# Patient Record
Sex: Male | Born: 1940 | Race: White | Hispanic: No | Marital: Married | Smoking: Never smoker
Health system: Southern US, Community
[De-identification: ages and names within clinical notes are randomized; demographics above are authoritative.]

## PROBLEM LIST (undated history)

## (undated) DIAGNOSIS — K429 Umbilical hernia without obstruction or gangrene: Secondary | ICD-10-CM

## (undated) DIAGNOSIS — Z87442 Personal history of urinary calculi: Secondary | ICD-10-CM

## (undated) DIAGNOSIS — E785 Hyperlipidemia, unspecified: Secondary | ICD-10-CM

## (undated) DIAGNOSIS — R519 Headache, unspecified: Secondary | ICD-10-CM

## (undated) DIAGNOSIS — G5601 Carpal tunnel syndrome, right upper limb: Secondary | ICD-10-CM

## (undated) DIAGNOSIS — G4730 Sleep apnea, unspecified: Secondary | ICD-10-CM

## (undated) DIAGNOSIS — M1990 Unspecified osteoarthritis, unspecified site: Secondary | ICD-10-CM

## (undated) DIAGNOSIS — K219 Gastro-esophageal reflux disease without esophagitis: Secondary | ICD-10-CM

## (undated) DIAGNOSIS — K5790 Diverticulosis of intestine, part unspecified, without perforation or abscess without bleeding: Secondary | ICD-10-CM

## (undated) DIAGNOSIS — G4700 Insomnia, unspecified: Secondary | ICD-10-CM

## (undated) DIAGNOSIS — R51 Headache: Secondary | ICD-10-CM

## (undated) DIAGNOSIS — N400 Enlarged prostate without lower urinary tract symptoms: Secondary | ICD-10-CM

## (undated) DIAGNOSIS — E7800 Pure hypercholesterolemia, unspecified: Secondary | ICD-10-CM

## (undated) DIAGNOSIS — I10 Essential (primary) hypertension: Secondary | ICD-10-CM

## (undated) DIAGNOSIS — G43909 Migraine, unspecified, not intractable, without status migrainosus: Secondary | ICD-10-CM

## (undated) HISTORY — PX: HERNIA REPAIR: SHX51

## (undated) HISTORY — PX: UMBILICAL HERNIA REPAIR: SHX196

## (undated) HISTORY — PX: LITHOTRIPSY: SUR834

## (undated) HISTORY — PX: TONSILLECTOMY: SUR1361

## (undated) HISTORY — PX: CARDIAC CATHETERIZATION: SHX172

## (undated) HISTORY — PX: BUNIONECTOMY: SHX129

## (undated) HISTORY — PX: COLONOSCOPY: SHX174

---

## 1996-05-01 HISTORY — PX: SHOULDER ARTHROSCOPY WITH ROTATOR CUFF REPAIR: SHX5685

## 2015-07-14 DIAGNOSIS — I10 Essential (primary) hypertension: Secondary | ICD-10-CM | POA: Insufficient documentation

## 2015-07-14 DIAGNOSIS — N400 Enlarged prostate without lower urinary tract symptoms: Secondary | ICD-10-CM | POA: Insufficient documentation

## 2015-07-14 DIAGNOSIS — E785 Hyperlipidemia, unspecified: Secondary | ICD-10-CM | POA: Insufficient documentation

## 2016-07-14 ENCOUNTER — Ambulatory Visit
Admission: RE | Admit: 2016-07-14 | Discharge: 2016-07-14 | Disposition: A | Discharge status: Home / Self Care | Payer: Medicare Other | Source: Ambulatory Visit | Attending: Unknown Physician Specialty | Admitting: Unknown Physician Specialty

## 2016-07-14 ENCOUNTER — Other Ambulatory Visit: Payer: Self-pay | Admitting: Unknown Physician Specialty

## 2016-07-14 DIAGNOSIS — X58XXXA Exposure to other specified factors, initial encounter: Secondary | ICD-10-CM | POA: Insufficient documentation

## 2016-07-14 DIAGNOSIS — M25561 Pain in right knee: Secondary | ICD-10-CM | POA: Diagnosis present

## 2016-07-14 DIAGNOSIS — S83241A Other tear of medial meniscus, current injury, right knee, initial encounter: Secondary | ICD-10-CM | POA: Insufficient documentation

## 2016-07-14 DIAGNOSIS — M1711 Unilateral primary osteoarthritis, right knee: Secondary | ICD-10-CM | POA: Insufficient documentation

## 2016-09-14 ENCOUNTER — Encounter
Admission: RE | Admit: 2016-09-14 | Discharge: 2016-09-14 | Disposition: A | Discharge status: Home / Self Care | Payer: Medicare Other | Source: Ambulatory Visit | Attending: Orthopedic Surgery | Admitting: Orthopedic Surgery

## 2016-09-14 DIAGNOSIS — S83231A Complex tear of medial meniscus, current injury, right knee, initial encounter: Secondary | ICD-10-CM | POA: Diagnosis not present

## 2016-09-14 DIAGNOSIS — I10 Essential (primary) hypertension: Secondary | ICD-10-CM | POA: Insufficient documentation

## 2016-09-14 DIAGNOSIS — X58XXXA Exposure to other specified factors, initial encounter: Secondary | ICD-10-CM | POA: Insufficient documentation

## 2016-09-14 HISTORY — DX: Gastro-esophageal reflux disease without esophagitis: K21.9

## 2016-09-14 HISTORY — DX: Headache: R51

## 2016-09-14 HISTORY — DX: Headache, unspecified: R51.9

## 2016-09-14 HISTORY — DX: Pure hypercholesterolemia, unspecified: E78.00

## 2016-09-14 HISTORY — DX: Sleep apnea, unspecified: G47.30

## 2016-09-14 HISTORY — DX: Unspecified osteoarthritis, unspecified site: M19.90

## 2016-09-14 HISTORY — DX: Personal history of urinary calculi: Z87.442

## 2016-09-14 HISTORY — DX: Essential (primary) hypertension: I10

## 2016-09-14 LAB — CBC
HCT: 45.7 % (ref 40.0–52.0)
HEMOGLOBIN: 15.7 g/dL (ref 13.0–18.0)
MCH: 29.5 pg (ref 26.0–34.0)
MCHC: 34.4 g/dL (ref 32.0–36.0)
MCV: 85.7 fL (ref 80.0–100.0)
Platelets: 148 10*3/uL — ABNORMAL LOW (ref 150–440)
RBC: 5.33 MIL/uL (ref 4.40–5.90)
RDW: 13.3 % (ref 11.5–14.5)
WBC: 7.4 10*3/uL (ref 3.8–10.6)

## 2016-09-14 LAB — DIFFERENTIAL
BASOS PCT: 1 %
Basophils Absolute: 0.1 10*3/uL (ref 0–0.1)
EOS ABS: 0.1 10*3/uL (ref 0–0.7)
EOS PCT: 2 %
LYMPHS ABS: 1.9 10*3/uL (ref 1.0–3.6)
Lymphocytes Relative: 25 %
Monocytes Absolute: 0.8 10*3/uL (ref 0.2–1.0)
Monocytes Relative: 10 %
NEUTROS PCT: 62 %
Neutro Abs: 4.6 10*3/uL (ref 1.4–6.5)

## 2016-09-14 NOTE — Patient Instructions (Signed)
  Your procedure is scheduled on: Sep 21, 2016 (Thursday) Report to Same Day Surgery 2nd floor medical mall [facility](Medical Mall Entrance-take elevator on left to 2nd floor.  Check in with surgery information desk.) To find out your arrival time please call [phone] between 1PM - 3PM on Sep 20, 2016 (Wednesday)   Remember: Instructions that are not followed completely may result in serious medical risk, up to and including death, or upon the discretion of your surgeon and anesthesiologist your surgery may need to be rescheduled.    _x___ 1. Do not eat food or drink liquids after midnight. No gum chewing or hard candies                               __x__ 2. No Alcohol for 24 hours before or after surgery.   __x__3. No Smoking for 24 prior to surgery.   ____  4. Bring all medications with you on the day of surgery if instructed.    __x__ 5. Notify your doctor if there is any change in your medical condition     (cold, fever, infections).     Do not wear jewelry, make-up, hairpins, clips or nail polish.  Do not wear lotions, powders, or perfumes.   Do not shave 48 hours prior to surgery. Men may shave face and neck.  Do not bring valuables to the hospital.    [facility] is not responsible for any belongings or valuables.               Contacts, dentures or bridgework may not be worn into surgery.  Leave your suitcase in the car. After surgery it may be brought to your room.  For patients admitted to the hospital, discharge time is determined by your treatment team                        Patients discharged the day of surgery will not be allowed to drive home.  You will need someone to drive you home and stay with you the night of your procedure.    Please read over the following fact sheets that you were given:   [facility] Preparing for Surgery and or MRSA Information   _x___ Take anti-hypertensive (unless it includes a diuretic), cardiac, seizure, asthma,     anti-reflux and  psychiatric medicines. These include:  1.   2.  3.  4.  5.  6.  ____Fleets enema or Magnesium Citrate as directed.   _x___ Use CHG Soap or sage wipes as directed on instruction sheet   ____ Use inhalers on the day of surgery and bring to hospital day of surgery  ____ Stop Metformin and Janumet 2 days prior to surgery.    ____ Take 1/2 of usual insulin dose the night before surgery and none on the morning     surgery.   _x___ Follow recommendations from Cardiologist, Pulmonologist or PCP regarding          stopping Aspirin, Coumadin, Pllavix ,Eliquis, Effient, or Pradaxa, and Pletal. (STOP ASPIRIN NOW)  X____Stop Anti-inflammatories such as Advil, Aleve, Ibuprofen, Motrin, Naproxen, Naprosyn, Goodies powders or aspirin products. OK to take Tylenol  (STOP VOLTAREN GEL NOW)  _x___ Stop supplements until after surgery.  But may continue Vitamin D, Vitamin B,  and multivitamin    _x___ Bring C-Pap to the hospital.

## 2016-09-20 MED ORDER — CEFAZOLIN SODIUM-DEXTROSE 2-4 GM/100ML-% IV SOLN
2.0000 g | Freq: Once | INTRAVENOUS | Status: AC
  Administered 2016-09-21: 2 g via INTRAVENOUS

## 2016-09-21 ENCOUNTER — Ambulatory Visit: Payer: Medicare Other | Admitting: Anesthesiology

## 2016-09-21 ENCOUNTER — Encounter: Payer: Self-pay | Admitting: *Deleted

## 2016-09-21 ENCOUNTER — Encounter
Admission: RE | Disposition: A | Discharge status: Home / Self Care | Payer: Self-pay | Source: Ambulatory Visit | Attending: Orthopedic Surgery

## 2016-09-21 ENCOUNTER — Ambulatory Visit
Admission: RE | Admit: 2016-09-21 | Discharge: 2016-09-21 | Disposition: A | Discharge status: Home / Self Care | Payer: Medicare Other | Source: Ambulatory Visit | Attending: Orthopedic Surgery | Admitting: Orthopedic Surgery

## 2016-09-21 DIAGNOSIS — Z803 Family history of malignant neoplasm of breast: Secondary | ICD-10-CM | POA: Insufficient documentation

## 2016-09-21 DIAGNOSIS — X58XXXA Exposure to other specified factors, initial encounter: Secondary | ICD-10-CM | POA: Insufficient documentation

## 2016-09-21 DIAGNOSIS — Z823 Family history of stroke: Secondary | ICD-10-CM | POA: Insufficient documentation

## 2016-09-21 DIAGNOSIS — K219 Gastro-esophageal reflux disease without esophagitis: Secondary | ICD-10-CM | POA: Insufficient documentation

## 2016-09-21 DIAGNOSIS — G4730 Sleep apnea, unspecified: Secondary | ICD-10-CM | POA: Diagnosis not present

## 2016-09-21 DIAGNOSIS — Z7982 Long term (current) use of aspirin: Secondary | ICD-10-CM | POA: Insufficient documentation

## 2016-09-21 DIAGNOSIS — Z8042 Family history of malignant neoplasm of prostate: Secondary | ICD-10-CM | POA: Diagnosis not present

## 2016-09-21 DIAGNOSIS — E785 Hyperlipidemia, unspecified: Secondary | ICD-10-CM | POA: Insufficient documentation

## 2016-09-21 DIAGNOSIS — Z79899 Other long term (current) drug therapy: Secondary | ICD-10-CM | POA: Insufficient documentation

## 2016-09-21 DIAGNOSIS — G43909 Migraine, unspecified, not intractable, without status migrainosus: Secondary | ICD-10-CM | POA: Insufficient documentation

## 2016-09-21 DIAGNOSIS — S83231A Complex tear of medial meniscus, current injury, right knee, initial encounter: Secondary | ICD-10-CM | POA: Insufficient documentation

## 2016-09-21 DIAGNOSIS — I10 Essential (primary) hypertension: Secondary | ICD-10-CM | POA: Insufficient documentation

## 2016-09-21 DIAGNOSIS — Z833 Family history of diabetes mellitus: Secondary | ICD-10-CM | POA: Diagnosis not present

## 2016-09-21 HISTORY — PX: KNEE ARTHROSCOPY: SHX127

## 2016-09-21 SURGERY — ARTHROSCOPY, KNEE
Anesthesia: General | Site: Knee | Laterality: Right | Wound class: Clean

## 2016-09-21 MED ORDER — ONDANSETRON HCL 4 MG/2ML IJ SOLN
INTRAMUSCULAR | Status: DC | PRN
  Administered 2016-09-21: 4 mg via INTRAVENOUS

## 2016-09-21 MED ORDER — HYDROCODONE-ACETAMINOPHEN 5-325 MG PO TABS
ORAL_TABLET | ORAL | Status: AC
  Administered 2016-09-21: 1 via ORAL
  Filled 2016-09-21: qty 1

## 2016-09-21 MED ORDER — LIDOCAINE HCL (CARDIAC) 20 MG/ML IV SOLN: INTRAVENOUS | Status: DC | PRN

## 2016-09-21 MED ORDER — FAMOTIDINE 20 MG PO TABS
20.0000 mg | ORAL_TABLET | Freq: Once | ORAL | Status: AC
  Administered 2016-09-21: 20 mg via ORAL

## 2016-09-21 MED ORDER — BUPIVACAINE-EPINEPHRINE (PF) 0.5% -1:200000 IJ SOLN
INTRAMUSCULAR | Status: AC
Start: 2016-09-21 — End: ?
  Filled 2016-09-21: qty 30

## 2016-09-21 MED ORDER — CEFAZOLIN SODIUM-DEXTROSE 2-4 GM/100ML-% IV SOLN
INTRAVENOUS | Status: AC
  Filled 2016-09-21: qty 100

## 2016-09-21 MED ORDER — PROPOFOL 10 MG/ML IV BOLUS
INTRAVENOUS | Status: AC
  Filled 2016-09-21: qty 20

## 2016-09-21 MED ORDER — FENTANYL CITRATE (PF) 100 MCG/2ML IJ SOLN
INTRAMUSCULAR | Status: AC
  Administered 2016-09-21: 25 ug via INTRAVENOUS
  Filled 2016-09-21: qty 2

## 2016-09-21 MED ORDER — ACETAMINOPHEN 10 MG/ML IV SOLN
INTRAVENOUS | Status: DC | PRN
  Administered 2016-09-21: 1000 mg via INTRAVENOUS

## 2016-09-21 MED ORDER — FENTANYL CITRATE (PF) 100 MCG/2ML IJ SOLN
25.0000 ug | INTRAMUSCULAR | Status: AC | PRN
  Administered 2016-09-21 (×6): 25 ug via INTRAVENOUS

## 2016-09-21 MED ORDER — FENTANYL CITRATE (PF) 100 MCG/2ML IJ SOLN
INTRAMUSCULAR | Status: DC | PRN
  Administered 2016-09-21: 100 ug via INTRAVENOUS

## 2016-09-21 MED ORDER — PROPOFOL 10 MG/ML IV BOLUS
INTRAVENOUS | Status: DC | PRN
  Administered 2016-09-21: 150 mg via INTRAVENOUS

## 2016-09-21 MED ORDER — ONDANSETRON HCL 4 MG/2ML IJ SOLN: 4.0000 mg | Freq: Once | INTRAMUSCULAR | Status: DC | PRN

## 2016-09-21 MED ORDER — HYDROCODONE-ACETAMINOPHEN 5-325 MG PO TABS: 1.0000 | ORAL_TABLET | ORAL | 0 refills | Status: DC | PRN

## 2016-09-21 MED ORDER — ONDANSETRON HCL 4 MG/2ML IJ SOLN
INTRAMUSCULAR | Status: AC
  Filled 2016-09-21: qty 2

## 2016-09-21 MED ORDER — KETOROLAC TROMETHAMINE 30 MG/ML IJ SOLN
INTRAMUSCULAR | Status: AC
  Filled 2016-09-21: qty 1

## 2016-09-21 MED ORDER — MIDAZOLAM HCL 2 MG/2ML IJ SOLN
INTRAMUSCULAR | Status: DC | PRN
  Administered 2016-09-21: 2 mg via INTRAVENOUS

## 2016-09-21 MED ORDER — FENTANYL CITRATE (PF) 100 MCG/2ML IJ SOLN
INTRAMUSCULAR | Status: AC
  Filled 2016-09-21: qty 2

## 2016-09-21 MED ORDER — MIDAZOLAM HCL 2 MG/2ML IJ SOLN
INTRAMUSCULAR | Status: AC
  Filled 2016-09-21: qty 2

## 2016-09-21 MED ORDER — DEXAMETHASONE SODIUM PHOSPHATE 10 MG/ML IJ SOLN
INTRAMUSCULAR | Status: DC | PRN
  Administered 2016-09-21: 10 mg via INTRAVENOUS

## 2016-09-21 MED ORDER — LIDOCAINE HCL (PF) 2 % IJ SOLN
INTRAMUSCULAR | Status: AC
  Filled 2016-09-21: qty 2

## 2016-09-21 MED ORDER — DEXAMETHASONE SODIUM PHOSPHATE 10 MG/ML IJ SOLN
INTRAMUSCULAR | Status: AC
  Filled 2016-09-21: qty 1

## 2016-09-21 MED ORDER — BUPIVACAINE-EPINEPHRINE (PF) 0.5% -1:200000 IJ SOLN
INTRAMUSCULAR | Status: DC | PRN
  Administered 2016-09-21: 20 mL via PERINEURAL

## 2016-09-21 MED ORDER — SUCCINYLCHOLINE CHLORIDE 20 MG/ML IJ SOLN
INTRAMUSCULAR | Status: DC | PRN
  Administered 2016-09-21: 100 mg via INTRAVENOUS

## 2016-09-21 MED ORDER — HYDROCODONE-ACETAMINOPHEN 5-325 MG PO TABS
1.0000 | ORAL_TABLET | Freq: Once | ORAL | Status: AC
  Administered 2016-09-21: 1 via ORAL

## 2016-09-21 MED ORDER — LACTATED RINGERS IV SOLN
INTRAVENOUS | Status: DC
  Administered 2016-09-21 (×2): via INTRAVENOUS

## 2016-09-21 MED ORDER — FAMOTIDINE 20 MG PO TABS
ORAL_TABLET | ORAL | Status: AC
  Administered 2016-09-21: 20 mg via ORAL
  Filled 2016-09-21: qty 1

## 2016-09-21 MED ORDER — SUGAMMADEX SODIUM 200 MG/2ML IV SOLN
INTRAVENOUS | Status: AC
  Filled 2016-09-21: qty 2

## 2016-09-21 MED ORDER — LIDOCAINE HCL (CARDIAC) 20 MG/ML IV SOLN
INTRAVENOUS | Status: DC | PRN
  Administered 2016-09-21: 100 mg via INTRAVENOUS

## 2016-09-21 MED ORDER — ACETAMINOPHEN 10 MG/ML IV SOLN
INTRAVENOUS | Status: AC
  Filled 2016-09-21: qty 100

## 2016-09-21 MED ORDER — SUGAMMADEX SODIUM 500 MG/5ML IV SOLN
INTRAVENOUS | Status: DC | PRN
  Administered 2016-09-21: 181.4 mg via INTRAVENOUS

## 2016-09-21 MED ORDER — KETOROLAC TROMETHAMINE 30 MG/ML IJ SOLN
INTRAMUSCULAR | Status: DC | PRN
  Administered 2016-09-21: 30 mg via INTRAVENOUS

## 2016-09-21 MED ORDER — ROCURONIUM BROMIDE 100 MG/10ML IV SOLN
INTRAVENOUS | Status: DC | PRN
  Administered 2016-09-21: 50 mg via INTRAVENOUS

## 2016-09-21 SURGICAL SUPPLY — 26 items
BANDAGE ACE 4X5 VEL STRL LF (GAUZE/BANDAGES/DRESSINGS) IMPLANT
BLADE INCISOR PLUS 4.5 (BLADE) IMPLANT
CHLORAPREP W/TINT 26ML (MISCELLANEOUS) ×2 IMPLANT
CUFF TOURN 24 STER (MISCELLANEOUS) ×2 IMPLANT
CUFF TOURN 30 STER DUAL PORT (MISCELLANEOUS) IMPLANT
GAUZE SPONGE 4X4 12PLY STRL (GAUZE/BANDAGES/DRESSINGS) ×2 IMPLANT
GLOVE BIO SURGEON STRL SZ7 (GLOVE) ×6 IMPLANT
GLOVE INDICATOR 7.5 STRL GRN (GLOVE) ×6 IMPLANT
GLOVE SURG SYN 9.0  PF PI (GLOVE) ×1
GLOVE SURG SYN 9.0 PF PI (GLOVE) ×1 IMPLANT
GOWN SRG 2XL LVL 4 RGLN SLV (GOWNS) ×1 IMPLANT
GOWN STRL NON-REIN 2XL LVL4 (GOWNS) ×1
GOWN STRL REUS W/ TWL LRG LVL3 (GOWN DISPOSABLE) ×3 IMPLANT
GOWN STRL REUS W/TWL LRG LVL3 (GOWN DISPOSABLE) ×3
IV LACTATED RINGER IRRG 3000ML (IV SOLUTION) ×3
IV LR IRRIG 3000ML ARTHROMATIC (IV SOLUTION) ×3 IMPLANT
KIT RM TURNOVER STRD PROC AR (KITS) ×2 IMPLANT
MANIFOLD NEPTUNE II (INSTRUMENTS) ×2 IMPLANT
PACK ARTHROSCOPY KNEE (MISCELLANEOUS) ×2 IMPLANT
SET TUBE SUCT SHAVER OUTFL 24K (TUBING) ×2 IMPLANT
SET TUBE TIP INTRA-ARTICULAR (MISCELLANEOUS) ×2 IMPLANT
SUT ETHILON 4-0 (SUTURE) ×1
SUT ETHILON 4-0 FS2 18XMFL BLK (SUTURE) ×1
SUTURE ETHLN 4-0 FS2 18XMF BLK (SUTURE) ×1 IMPLANT
TUBING ARTHRO INFLOW-ONLY STRL (TUBING) ×2 IMPLANT
WAND COBLATION FLOW 50 (SURGICAL WAND) ×2 IMPLANT

## 2016-09-21 NOTE — Anesthesia Procedure Notes (Signed)
Procedure Name: Intubation Date/Time: 09/21/2016 9:46 AM Performed by: Nelda Marseille Pre-anesthesia Checklist: Patient identified, Patient being monitored, Timeout performed, Emergency Drugs available and Suction available Patient Re-evaluated:Patient Re-evaluated prior to inductionOxygen Delivery Method: Circle system utilized Preoxygenation: Pre-oxygenation with 100% oxygen Intubation Type: IV induction Ventilation: Mask ventilation without difficulty Laryngoscope Size: Mac, 3 and McGraph Grade View: Grade I Tube type: Oral Tube size: 7.5 mm Number of attempts: 1 Airway Equipment and Method: Stylet and Video-laryngoscopy Placement Confirmation: ETT inserted through vocal cords under direct vision,  positive ETCO2 and breath sounds checked- equal and bilateral Secured at: 21 cm Tube secured with: Tape Dental Injury: Teeth and Oropharynx as per pre-operative assessment

## 2016-09-21 NOTE — H&P (Signed)
Reviewed paper H+P, will be scanned into chart. Patient examined No changes noted.  

## 2016-09-21 NOTE — Discharge Instructions (Addendum)
Keep dressing clean and dry. If bandage slides down the leg, remove entire bandage and used to Band-Aids then rewrap with Ace wrap only. Continue aspirin daily. Pain medicine as directed could try half a tablet at a time   AMBULATORY SURGERY  DISCHARGE INSTRUCTIONS   1) The drugs that you were given will stay in your system until tomorrow so for the next 24 hours you should not:  A) Drive an automobile B) Make any legal decisions C) Drink any alcoholic beverage   2) You may resume regular meals tomorrow.  Today it is better to start with liquids and gradually work up to solid foods.  You may eat anything you prefer, but it is better to start with liquids, then soup and crackers, and gradually work up to solid foods.   3) Please notify your doctor immediately if you have any unusual bleeding, trouble breathing, redness and pain at the surgery site, drainage, fever, or pain not relieved by medication.    Please contact your physician with any problems or Same Day Surgery at [phone], Monday through Friday 6 am to 4 pm, or [place] at [facility] Main number at [phone].

## 2016-09-21 NOTE — Op Note (Signed)
09/21/2016  10:27 AM  PATIENT:  Calvin Wallace  76 y.o. male  PRE-OPERATIVE DIAGNOSIS:  COMPLEX TEAR OF MEDIAL MENISCUS OF RIGHT KNEE AS CURRENT INJURY  POST-OPERATIVE DIAGNOSIS:  COMPLEX TEAR OF MEDIAL MENISCUS OF RIGHT KNEE AS CURRENT INJURY  PROCEDURE:  Procedure(s): ARTHROSCOPY KNEE MEDIAL MENISCECTOMY (Right)  SURGEON: Leitha SchullerMichael J Natan Hartog, MD  ASSISTANTS: None  ANESTHESIA:   general  EBL:  No intake/output data recorded.  BLOOD ADMINISTERED:none  DRAINS: none   LOCAL MEDICATIONS USED:  MARCAINE     SPECIMEN:  No Specimen  DISPOSITION OF SPECIMEN:  N/A  COUNTS:  YES  TOURNIQUET:   none  IMPLANTS: None  DICTATION: .Dragon Dictation patient brought the operating room and after adequate anesthesia was obtained the right leg was prepped and draped in sterile fashion with an arthroscopic leg holder and tourniquet applied. After patient identification and timeout procedures were completed, an inferior lateral portal was made and the arthroscope was introduced. Initial inspection revealed mild patellofemoral degenerative change with normal tracking. Coming around medially there inferior medial portal was made on probing there is a complex tear of the posterior and middle thirds of the medial meniscus consistent with MRI findings. There is also extensive chondrocalcinosis present a meniscus punch was used to debride the meniscus back with an ArthroCare wand to ablate the edges to smooth out the edges and get rid of some loose frayed pieces of the meniscus. This probably involved the inner two thirds of the posterior horn and a portion of the middle third. There was moderate degenerative change with chondrocalcinosis visible in the articular cartilage of the medial compartment. Lateral compartment was normal and the anterior cruciate ligament was intact the gutters were free of any loose bodies after thoroughly irrigating out the knee argentation was withdrawn and the wounds closed with  simple 4-0 nylon. 20 cc of half percent Sensorcaine with epinephrine was infiltrated near the portals and the wound dressed with Xeroform 4 x 4 web roll and Ace wrap  PLAN OF CARE: Discharge to home after PACU  PATIENT DISPOSITION:  PACU - hemodynamically stable.

## 2016-09-21 NOTE — Anesthesia Postprocedure Evaluation (Signed)
Anesthesia Post Note  Patient: Calvin Wallace  Procedure(s) Performed: Procedure(s) (LRB): ARTHROSCOPY KNEE MEDIAL MENISCECTOMY (Right)  Patient location during evaluation: PACU Anesthesia Type: General Level of consciousness: awake and alert Pain management: pain level controlled Vital Signs Assessment: post-procedure vital signs reviewed and stable Respiratory status: spontaneous breathing and respiratory function stable Cardiovascular status: stable Anesthetic complications: no     Last Vitals: There were no vitals filed for this visit.  Last Pain: There were no vitals filed for this visit.               KEPHART,WILLIAM K

## 2016-09-21 NOTE — Anesthesia Preprocedure Evaluation (Signed)
Anesthesia Evaluation  Patient identified by MRN, date of birth, ID band Patient awake    Reviewed: Allergy & Precautions, NPO status , Patient's Chart, lab work & pertinent test results  History of Anesthesia Complications Negative for: history of anesthetic complications  Airway Mallampati: II       Dental   Pulmonary sleep apnea and Continuous Positive Airway Pressure Ventilation ,           Cardiovascular hypertension, Pt. on medications      Neuro/Psych negative neurological ROS     GI/Hepatic Neg liver ROS, GERD  Medicated,  Endo/Other  negative endocrine ROS  Renal/GU negative Renal ROS     Musculoskeletal   Abdominal   Peds  Hematology   Anesthesia Other Findings   Reproductive/Obstetrics                             Anesthesia Physical Anesthesia Plan  ASA: II  Anesthesia Plan: General   Post-op Pain Management:    Induction: Intravenous  Airway Management Planned: Oral ETT  Additional Equipment:   Intra-op Plan:   Post-operative Plan:   Informed Consent: I have reviewed the patients History and Physical, chart, labs and discussed the procedure including the risks, benefits and alternatives for the proposed anesthesia with the patient or authorized representative who has indicated his/her understanding and acceptance.     Plan Discussed with:   Anesthesia Plan Comments:         Anesthesia Quick Evaluation

## 2016-09-21 NOTE — Transfer of Care (Signed)
Immediate Anesthesia Transfer of Care Note  Patient: Calvin Wallace  Procedure(s) Performed: Procedure(s): ARTHROSCOPY KNEE MEDIAL MENISCECTOMY (Right)  Patient Location: PACU  Anesthesia Type:General  Level of Consciousness: sedated  Airway & Oxygen Therapy: Patient Spontanous Breathing and Patient connected to face mask oxygen  Post-op Assessment: Report given to RN and Post -op Vital signs reviewed and stable  Post vital signs: Reviewed and stable  Last Vitals: There were no vitals filed for this visit.  Last Pain: There were no vitals filed for this visit.       Complications: No apparent anesthesia complications

## 2016-09-21 NOTE — Anesthesia Post-op Follow-up Note (Cosign Needed)
Anesthesia QCDR form completed.        

## 2017-07-10 DIAGNOSIS — K219 Gastro-esophageal reflux disease without esophagitis: Secondary | ICD-10-CM | POA: Insufficient documentation

## 2017-07-19 ENCOUNTER — Encounter: Payer: Self-pay | Admitting: *Deleted

## 2017-07-20 ENCOUNTER — Ambulatory Visit: Payer: Medicare Other | Admitting: Anesthesiology

## 2017-07-20 ENCOUNTER — Encounter: Payer: Self-pay | Admitting: *Deleted

## 2017-07-20 ENCOUNTER — Encounter
Admission: RE | Disposition: A | Discharge status: Home / Self Care | Payer: Self-pay | Source: Ambulatory Visit | Attending: Unknown Physician Specialty

## 2017-07-20 ENCOUNTER — Ambulatory Visit
Admission: RE | Admit: 2017-07-20 | Discharge: 2017-07-20 | Disposition: A | Discharge status: Home / Self Care | Payer: Medicare Other | Source: Ambulatory Visit | Attending: Unknown Physician Specialty | Admitting: Unknown Physician Specialty

## 2017-07-20 DIAGNOSIS — Z79899 Other long term (current) drug therapy: Secondary | ICD-10-CM | POA: Diagnosis not present

## 2017-07-20 DIAGNOSIS — K2970 Gastritis, unspecified, without bleeding: Secondary | ICD-10-CM | POA: Diagnosis not present

## 2017-07-20 DIAGNOSIS — Z8371 Family history of colonic polyps: Secondary | ICD-10-CM | POA: Insufficient documentation

## 2017-07-20 DIAGNOSIS — K219 Gastro-esophageal reflux disease without esophagitis: Secondary | ICD-10-CM | POA: Diagnosis not present

## 2017-07-20 DIAGNOSIS — K3189 Other diseases of stomach and duodenum: Secondary | ICD-10-CM | POA: Insufficient documentation

## 2017-07-20 DIAGNOSIS — G43909 Migraine, unspecified, not intractable, without status migrainosus: Secondary | ICD-10-CM | POA: Diagnosis not present

## 2017-07-20 DIAGNOSIS — Z7982 Long term (current) use of aspirin: Secondary | ICD-10-CM | POA: Diagnosis not present

## 2017-07-20 DIAGNOSIS — K640 First degree hemorrhoids: Secondary | ICD-10-CM | POA: Diagnosis not present

## 2017-07-20 DIAGNOSIS — K449 Diaphragmatic hernia without obstruction or gangrene: Secondary | ICD-10-CM | POA: Diagnosis not present

## 2017-07-20 DIAGNOSIS — G4733 Obstructive sleep apnea (adult) (pediatric): Secondary | ICD-10-CM | POA: Diagnosis not present

## 2017-07-20 DIAGNOSIS — E7800 Pure hypercholesterolemia, unspecified: Secondary | ICD-10-CM | POA: Diagnosis not present

## 2017-07-20 DIAGNOSIS — K5730 Diverticulosis of large intestine without perforation or abscess without bleeding: Secondary | ICD-10-CM | POA: Insufficient documentation

## 2017-07-20 DIAGNOSIS — I10 Essential (primary) hypertension: Secondary | ICD-10-CM | POA: Insufficient documentation

## 2017-07-20 DIAGNOSIS — Z1211 Encounter for screening for malignant neoplasm of colon: Secondary | ICD-10-CM | POA: Insufficient documentation

## 2017-07-20 DIAGNOSIS — E785 Hyperlipidemia, unspecified: Secondary | ICD-10-CM | POA: Diagnosis not present

## 2017-07-20 HISTORY — PX: ESOPHAGOGASTRODUODENOSCOPY (EGD) WITH PROPOFOL: SHX5813

## 2017-07-20 HISTORY — PX: COLONOSCOPY WITH PROPOFOL: SHX5780

## 2017-07-20 HISTORY — DX: Migraine, unspecified, not intractable, without status migrainosus: G43.909

## 2017-07-20 HISTORY — DX: Umbilical hernia without obstruction or gangrene: K42.9

## 2017-07-20 HISTORY — DX: Hyperlipidemia, unspecified: E78.5

## 2017-07-20 SURGERY — COLONOSCOPY WITH PROPOFOL
Anesthesia: General

## 2017-07-20 MED ORDER — PROPOFOL 10 MG/ML IV BOLUS
INTRAVENOUS | Status: DC | PRN
  Administered 2017-07-20: 30 mg via INTRAVENOUS
  Administered 2017-07-20: 50 mg via INTRAVENOUS

## 2017-07-20 MED ORDER — PROPOFOL 500 MG/50ML IV EMUL
INTRAVENOUS | Status: AC
  Filled 2017-07-20: qty 50

## 2017-07-20 MED ORDER — FENTANYL CITRATE (PF) 100 MCG/2ML IJ SOLN
INTRAMUSCULAR | Status: DC | PRN
  Administered 2017-07-20: 50 ug via INTRAVENOUS

## 2017-07-20 MED ORDER — SODIUM CHLORIDE 0.9 % IV SOLN
INTRAVENOUS | Status: DC
  Administered 2017-07-20 (×2): via INTRAVENOUS

## 2017-07-20 MED ORDER — LIDOCAINE HCL (CARDIAC) 20 MG/ML IV SOLN
INTRAVENOUS | Status: DC | PRN
  Administered 2017-07-20: 100 mg via INTRAVENOUS

## 2017-07-20 MED ORDER — SODIUM CHLORIDE 0.9 % IV SOLN: INTRAVENOUS | Status: DC

## 2017-07-20 MED ORDER — LIDOCAINE HCL (PF) 2 % IJ SOLN
INTRAMUSCULAR | Status: AC
  Filled 2017-07-20: qty 10

## 2017-07-20 MED ORDER — PROPOFOL 500 MG/50ML IV EMUL
INTRAVENOUS | Status: DC | PRN
  Administered 2017-07-20: 200 ug/kg/min via INTRAVENOUS

## 2017-07-20 MED ORDER — FENTANYL CITRATE (PF) 100 MCG/2ML IJ SOLN
INTRAMUSCULAR | Status: AC
  Filled 2017-07-20: qty 2

## 2017-07-20 NOTE — Anesthesia Preprocedure Evaluation (Signed)
Anesthesia Evaluation  Patient identified by MRN, date of birth, ID band Patient awake    Reviewed: Allergy & Precautions, NPO status , Patient's Chart, lab work & pertinent test results  History of Anesthesia Complications Negative for: history of anesthetic complications  Airway Mallampati: II       Dental  (+) Implants, Dental Advidsory Given, Teeth Intact, Caps   Pulmonary neg shortness of breath, sleep apnea and Continuous Positive Airway Pressure Ventilation , neg COPD, neg recent URI,           Cardiovascular Exercise Tolerance: Good hypertension, Pt. on medications (-) angina(-) CAD, (-) Past MI, (-) Cardiac Stents and (-) CABG (-) dysrhythmias + Valvular Problems/Murmurs      Neuro/Psych  Headaches, neg Seizures    GI/Hepatic Neg liver ROS, GERD  Medicated,  Endo/Other  negative endocrine ROS  Renal/GU negative Renal ROS     Musculoskeletal   Abdominal   Peds  Hematology   Anesthesia Other Findings Past Medical History: No date: Arthritis No date: GERD (gastroesophageal reflux disease) No date: Headache     Comment:  migraines No date: History of kidney stones No date: Hypercholesteremia No date: Hyperlipidemia No date: Hypertension No date: Migraines No date: Sleep apnea     Comment:  OSA---USE C-PAP No date: Umbilical hernia   Reproductive/Obstetrics                             Anesthesia Physical  Anesthesia Plan  ASA: II  Anesthesia Plan: General   Post-op Pain Management:    Induction: Intravenous  PONV Risk Score and Plan: 1 and Propofol infusion  Airway Management Planned: Natural Airway and Nasal Cannula  Additional Equipment:   Intra-op Plan:   Post-operative Plan:   Informed Consent: I have reviewed the patients History and Physical, chart, labs and discussed the procedure including the risks, benefits and alternatives for the proposed anesthesia  with the patient or authorized representative who has indicated his/her understanding and acceptance.     Plan Discussed with:   Anesthesia Plan Comments:         Anesthesia Quick Evaluation

## 2017-07-20 NOTE — Transfer of Care (Signed)
Immediate Anesthesia Transfer of Care Note  Patient: Calvin Wallace  Procedure(s) Performed: COLONOSCOPY WITH PROPOFOL (N/A ) ESOPHAGOGASTRODUODENOSCOPY (EGD) WITH PROPOFOL (N/A )  Patient Location: PACU  Anesthesia Type:General  Level of Consciousness: sedated  Airway & Oxygen Therapy: Patient Spontanous Breathing and Patient connected to nasal cannula oxygen  Post-op Assessment: Report given to RN and Post -op Vital signs reviewed and stable  Post vital signs: Reviewed and stable  Last Vitals:  Vitals Value Taken Time  BP    Temp    Pulse 78 07/20/2017 11:46 AM  Resp 15 07/20/2017 11:46 AM  SpO2 92 % 07/20/2017 11:46 AM  Vitals shown include unvalidated device data.  Last Pain:  Vitals:   07/20/17 0947  TempSrc: Tympanic  PainSc: 0-No pain         Complications: No apparent anesthesia complications

## 2017-07-20 NOTE — Anesthesia Procedure Notes (Signed)
Date/Time: 07/20/2017 11:05 AM Performed by: Henrietta HooverPope, Jersey Espinoza, CRNA Pre-anesthesia Checklist: Timeout performed, Patient being monitored, Suction available, Emergency Drugs available and Patient identified Patient Re-evaluated:Patient Re-evaluated prior to induction Oxygen Delivery Method: Nasal cannula Ventilation: Mask ventilation without difficulty Placement Confirmation: positive ETCO2

## 2017-07-20 NOTE — Anesthesia Post-op Follow-up Note (Signed)
Anesthesia QCDR form completed.        

## 2017-07-20 NOTE — Op Note (Signed)
[facility] Gastroenterology Patient Name: Calvin Wallace Procedure Date: 07/20/2017 11:00 AM MRN: [id number] Account #: [id number] Date of Birth: 12-19-1940 Admit Type: Outpatient Age: 77 Room: [facility] ENDO ROOM 1 Gender: Male Note Status: Finalized Procedure:            Colonoscopy Indications:          Colon cancer screening in patient at increased risk:                        Family history of 1st-degree relative with colon polyps Providers:            Scot Jun, MD Medicines:            Propofol per Anesthesia Complications:        No immediate complications. Procedure:            Pre-Anesthesia Assessment:                       - After reviewing the risks and benefits, the patient                        was deemed in satisfactory condition to undergo the                        procedure.                       After obtaining informed consent, the colonoscope was                        passed under direct vision. Throughout the procedure,                        the patient's blood pressure, pulse, and oxygen                        saturations were monitored continuously. The                        Colonoscope was introduced through the anus and                        advanced to the the cecum, identified by appendiceal                        orifice and ileocecal valve. The colonoscopy was                        performed without difficulty. The patient tolerated the                        procedure well. The quality of the bowel preparation                        was excellent. Findings:      Multiple medium-mouthed diverticula were found in the sigmoid colon and       left colon.      Internal hemorrhoids were found during retroflexion. The hemorrhoids       were small and Grade I (internal hemorrhoids that do not prolapse).      The exam was otherwise without  abnormality. 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                       - Internal hemorrhoids.                       - The examination was otherwise normal.                       - No specimens collected. Recommendation:       - The findings and recommendations were discussed with                        the patient's family. Scot Junobert T Creighton Longley, MD 07/20/2017 11:41:31 AM This report has been signed electronically. Number of Addenda: 0 Note Initiated On: 07/20/2017 11:00 AM Scope Withdrawal Time: 0 hours 7 minutes 10 seconds  Total Procedure Duration: 0 hours 14 minutes 57 seconds       [facility]

## 2017-07-20 NOTE — Anesthesia Postprocedure Evaluation (Signed)
Anesthesia Post Note  Patient: Lu DuffelGary Kallal  Procedure(s) Performed: COLONOSCOPY WITH PROPOFOL (N/A ) ESOPHAGOGASTRODUODENOSCOPY (EGD) WITH PROPOFOL (N/A )  Patient location during evaluation: Endoscopy Anesthesia Type: General Level of consciousness: awake and alert Pain management: pain level controlled Vital Signs Assessment: post-procedure vital signs reviewed and stable Respiratory status: spontaneous breathing, nonlabored ventilation, respiratory function stable and patient connected to nasal cannula oxygen Cardiovascular status: blood pressure returned to baseline and stable Postop Assessment: no apparent nausea or vomiting Anesthetic complications: no     Last Vitals:  Vitals Value Taken Time  BP 131/82 07/20/2017 12:17 PM  Temp    Pulse 62 07/20/2017 12:17 PM  Resp 15 07/20/2017 12:16 PM  SpO2 99 % 07/20/2017 12:17 PM  Vitals shown include unvalidated device data.  Last Pain:  Vitals:   07/20/17 1205  TempSrc:   PainSc: 0-No pain                 Lenard SimmerAndrew Davonte Siebenaler

## 2017-07-20 NOTE — H&P (Signed)
Primary Care Physician:  Dorothey BasemanBronstein, David, MD Primary Gastroenterologist:  Dr. Mechele CollinElliott  Pre-Procedure History & Physical: HPI:  Calvin DuffelGary Wallace is a 77 y.o. male is here for an endoscopy and colonoscopy.   Past Medical History:  Diagnosis Date  . Arthritis   . GERD (gastroesophageal reflux disease)   . Headache    migraines  . History of kidney stones   . Hypercholesteremia   . Hyperlipidemia   . Hypertension   . Migraines   . Sleep apnea    OSA---USE C-PAP  . Umbilical hernia     Past Surgical History:  Procedure Laterality Date  . BUNIONECTOMY Left   . CARDIAC CATHETERIZATION    . COLONOSCOPY    . HERNIA REPAIR Right    Inguinal Hernia Repair  . HERNIA REPAIR     Umbilical Hernia Repair  . KNEE ARTHROSCOPY Right 09/21/2016   Procedure: ARTHROSCOPY KNEE MEDIAL MENISCECTOMY;  Surgeon: Calvin Wallace, Michael, MD;  Location: [facility] ORS;  Service: Orthopedics;  Laterality: Right;  . LITHOTRIPSY    . SHOULDER ARTHROSCOPY WITH ROTATOR CUFF REPAIR Right 1998  . TONSILLECTOMY     Age 77  . UMBILICAL HERNIA REPAIR      Prior to Admission medications   Medication Sig Start Date End Date Taking? Authorizing Provider  amLODipine (NORVASC) 5 MG tablet Take 5 mg by mouth daily.   Yes [provider]  desonide (DESOWEN) 0.05 % cream Apply topically as needed.   Yes [provider]  doxepin (SINEQUAN) 10 MG capsule Take 10 mg by mouth at bedtime.   Yes [provider]  finasteride (PROSCAR) 5 MG tablet Take 5 mg by mouth every evening.   Yes [provider]  ketoconazole (NIZORAL) 2 % cream Apply 1 application topically once a week.   Yes [provider]  lisinopril (PRINIVIL,ZESTRIL) 40 MG tablet Take 40 mg by mouth every evening.   Yes [provider]  omeprazole (PRILOSEC) 20 MG capsule Take 20 mg by mouth daily.   Yes [provider]  pravastatin (PRAVACHOL) 40 MG tablet Take 40 mg by mouth every evening.   Yes [provider]  ranitidine (ZANTAC) 150 MG tablet Take 150 mg by mouth daily.   Yes [provider]  tamsulosin (FLOMAX) 0.4 MG CAPS capsule Take 0.4 mg by mouth every evening.   Yes [provider]  aspirin EC 81 MG tablet Take 81 mg by mouth daily.    [provider]  diclofenac sodium (VOLTAREN) 1 % GEL Apply 1 application topically 2 (two) times daily as needed (pain).    [provider]  HYDROcodone-acetaminophen (NORCO) 5-325 MG tablet Take 1 tablet by mouth every 4 (four) hours as needed for moderate pain. 09/21/16   Calvin Wallace, Michael, MD  SUMAtriptan (IMITREX) 6 MG/0.5ML SOLN injection Inject 6 mg into the skin every 2 (two) hours as needed for migraine or headache. May repeat in 2 hours if headache persists or recurs.    [provider]    Allergies as of 07/12/2017 - Review Complete 09/21/2016  Allergen Reaction Noted  . Amino acids  09/11/2016    Family History  Problem Relation Age of Onset  . Breast cancer Mother   . Diabetes Father   . Hypertension Father   . Stroke Father   . Prostate cancer Father   . Colon polyps Sister     Social History   Socioeconomic History  . Marital status: Married    Spouse name: Not  on file  . Number of children: Not on file  . Years of education: Not on file  . Highest education level: Not on file  Occupational History  . Not on file  Social Needs  . Financial resource strain: Not on file  . Food insecurity:    Worry: Not on file    Inability: Not on file  . Transportation needs:    Medical: Not on file    Non-medical: Not on file  Tobacco Use  . Smoking status: Never Smoker  . Smokeless tobacco: Never Used  Substance and Sexual Activity  . Alcohol use: No  . Drug use: No  . Sexual activity: Not on file    Comment: Married   Lifestyle  . Physical activity:    Days per week: Not on file    Minutes per session: Not on file  . Stress: Not on file  Relationships  . Social  connections:    Talks on phone: Not on file    Gets together: Not on file    Attends religious service: Not on file    Active member of club or organization: Not on file    Attends meetings of clubs or organizations: Not on file    Relationship status: Not on file  . Intimate partner violence:    Fear of current or ex partner: Not on file    Emotionally abused: Not on file    Physically abused: Not on file    Forced sexual activity: Not on file  Other Topics Concern  . Not on file  Social History Narrative  . Not on file    Review of Systems: See HPI, otherwise negative ROS  Physical Exam: BP 138/81   Pulse 80   Temp (!) 97 F (36.1 C) (Tympanic)   Resp 16   Ht 5\' 11"  (1.803 m)   Wt 88.9 kg (196 lb)   SpO2 100%   BMI 27.34 kg/m  General:   Alert,  pleasant and cooperative in NAD Head:  Normocephalic and atraumatic. Neck:  Supple; no masses or thyromegaly. Lungs:  Clear throughout to auscultation.    Heart:  Regular rate and rhythm. Abdomen:  Soft, nontender and nondistended. Normal bowel sounds, without guarding, and without rebound.   Neurologic:  Alert and  oriented x4;  grossly normal neurologically.  Impression/Plan: Calvin Wallace is here for an endoscopy and colonoscopy to be performed for GERD and famiily history of colon polyps.  Risks, benefits, limitations, and alternatives regarding  endoscopy and colonoscopy have been reviewed with the patient.  Questions have been answered.  All parties agreeable.   Lynnae Prude, MD  07/20/2017, 10:47 AM

## 2017-07-20 NOTE — Op Note (Signed)
[facility] Gastroenterology Patient Name: Calvin DuffelGary Wallace Procedure Date: 07/20/2017 10:59 AM MRN: [id number] Account #: [id number] Date of Birth: 01/10/1941 Admit Type: Outpatient Age: [age] [facility]OM 1 Gender: Male Note Status: Finalized Procedure:            Upper GI endoscopy Indications:          Heartburn Providers:            Scot Junobert T. Elliott, MD Medicines:            Propofol per Anesthesia Complications:        No immediate complications. Procedure:            Pre-Anesthesia Assessment:                       - After reviewing the risks and benefits, the patient                        was deemed in satisfactory condition to undergo the                        procedure.                       After obtaining informed consent, the endoscope was                        passed under direct vision. Throughout the procedure,                        the patient's blood pressure, pulse, and oxygen                        saturations were monitored continuously. The Endoscope                        was introduced through the mouth, and advanced to the                        second part of duodenum. The upper GI endoscopy was                        accomplished without difficulty. The patient tolerated                        the procedure well. Findings:      A medium-sized hiatal hernia was present. GEJ 40cm.      Patchy mild inflammation characterized by erythema and granularity was       found in the gastric antrum. Biopsies were taken with a cold forceps for       histology. Biopsies were taken with a cold forceps for Helicobacter       pylori testing.      The examined duodenum was normal. Impression:           - Medium-sized hiatal hernia.                       - Gastritis. Biopsied.                       - Normal examined duodenum. Recommendation:       - Await pathology  results. Scot Jun, MD 07/20/2017 11:19:41 AM This report has been signed  electronically. Number of Addenda: 0 Note Initiated On: 07/20/2017 10:59 AM      [facility]

## 2017-07-23 ENCOUNTER — Encounter: Payer: Self-pay | Admitting: Unknown Physician Specialty

## 2017-07-23 LAB — SURGICAL PATHOLOGY

## 2017-07-30 DIAGNOSIS — G4733 Obstructive sleep apnea (adult) (pediatric): Secondary | ICD-10-CM | POA: Insufficient documentation

## 2017-11-12 DIAGNOSIS — K5790 Diverticulosis of intestine, part unspecified, without perforation or abscess without bleeding: Secondary | ICD-10-CM | POA: Insufficient documentation

## 2018-07-01 DIAGNOSIS — G4700 Insomnia, unspecified: Secondary | ICD-10-CM | POA: Insufficient documentation

## 2018-09-13 ENCOUNTER — Other Ambulatory Visit: Payer: Self-pay | Admitting: Family Medicine

## 2018-09-13 ENCOUNTER — Other Ambulatory Visit (HOSPITAL_COMMUNITY): Payer: Self-pay | Admitting: Family Medicine

## 2018-09-13 ENCOUNTER — Other Ambulatory Visit (HOSPITAL_COMMUNITY): Payer: Self-pay | Admitting: Neurology

## 2018-09-13 ENCOUNTER — Ambulatory Visit
Admission: RE | Admit: 2018-09-13 | Discharge: 2018-09-13 | Disposition: A | Discharge status: Home / Self Care | Payer: Medicare Other | Source: Ambulatory Visit | Attending: Family Medicine | Admitting: Family Medicine

## 2018-09-13 ENCOUNTER — Other Ambulatory Visit: Payer: Self-pay

## 2018-09-13 DIAGNOSIS — R51 Headache: Secondary | ICD-10-CM | POA: Diagnosis present

## 2018-09-13 DIAGNOSIS — M542 Cervicalgia: Secondary | ICD-10-CM

## 2018-09-13 DIAGNOSIS — R519 Headache, unspecified: Secondary | ICD-10-CM

## 2018-09-13 LAB — POCT I-STAT CREATININE: Creatinine, Ser: 1.1 mg/dL (ref 0.61–1.24)

## 2018-09-13 MED ORDER — IOHEXOL 300 MG/ML  SOLN
75.0000 mL | Freq: Once | INTRAMUSCULAR | Status: AC | PRN
  Administered 2018-09-13: 15:00:00 75 mL via INTRAVENOUS

## 2018-09-18 ENCOUNTER — Ambulatory Visit: Payer: Medicare Other

## 2019-07-10 IMAGING — CT CT NECK WITH CONTRAST
4 of 5 series · 14 of 33 positions shown, 16 images · IV contrast (omnipaque)
Comparison: None.

CLINICAL DATA: One-week history of right-sided headache with
exertion. Hypertension.

EXAM:
CT NECK WITH CONTRAST
TECHNIQUE: Multidetector CT imaging of the neck was performed using the
standard protocol following the bolus administration of intravenous
contrast.
CONTRAST:  75mL OMNIPAQUE IOHEXOL 300 MG/ML  SOLN

[Series 2: axial neck neck (person_name) · axial · 0.66mm/px · z∈[-784,-698]mm · 2 of 131 slices shown]
[im 44/131  bone]
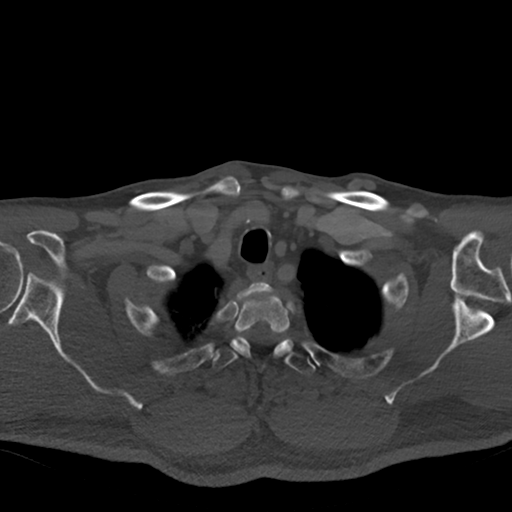
[im 87/131  bone]
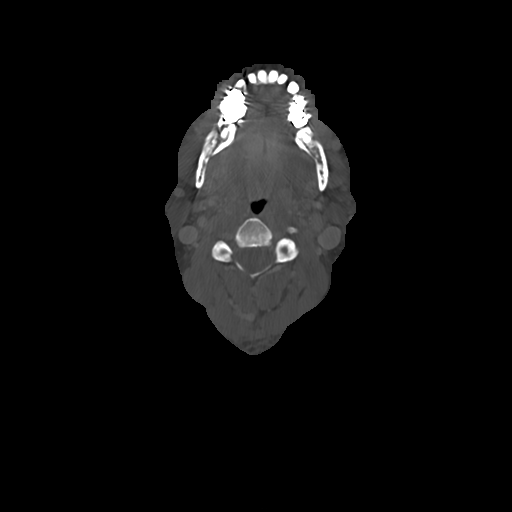

[Series 4: coronal neck neck (person_name) · coronal · 0.55mm/px · 3 of 160 slices shown]
[im 64/160  bone]
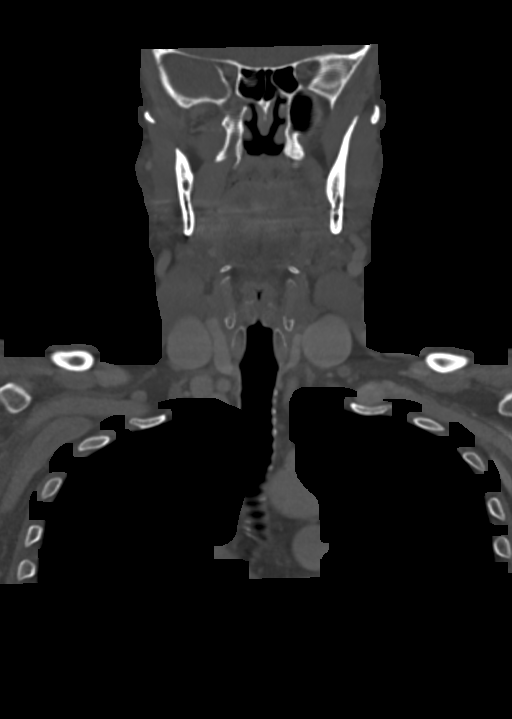
[im 75/160  bone]
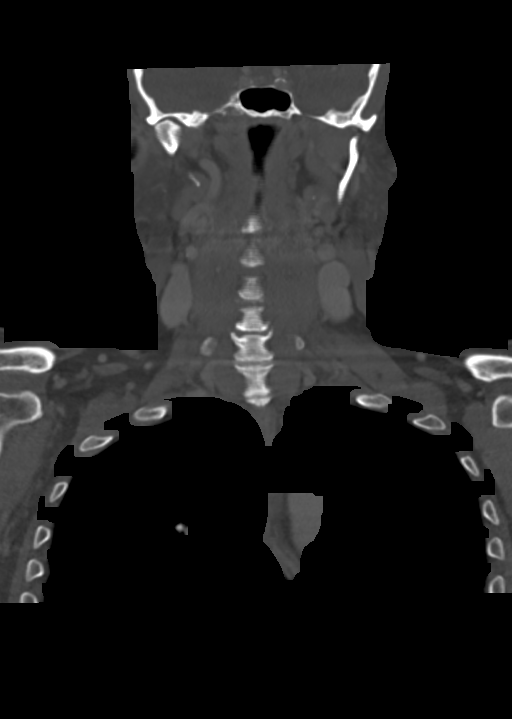
[im 86/160  bone]
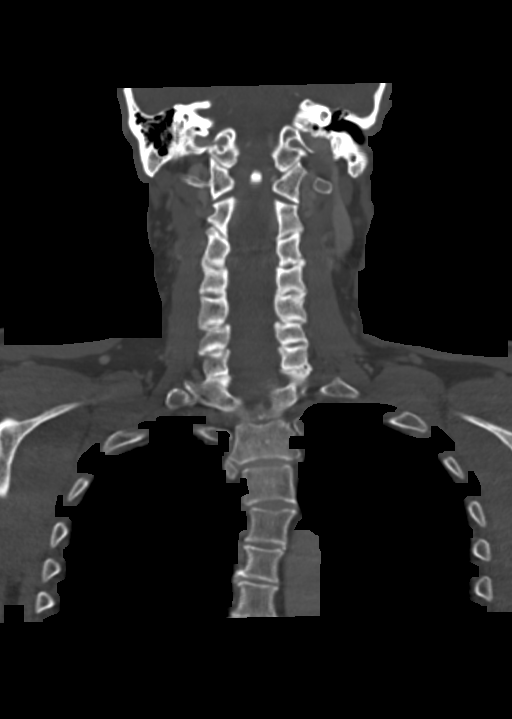

[Series 6: ax oropharynx neck neck (person_name) · axial · 0.55mm/px · z∈[-925,-711]mm · 4 of 199 slices shown, 5 images]
[im 40/199  soft-tissue]
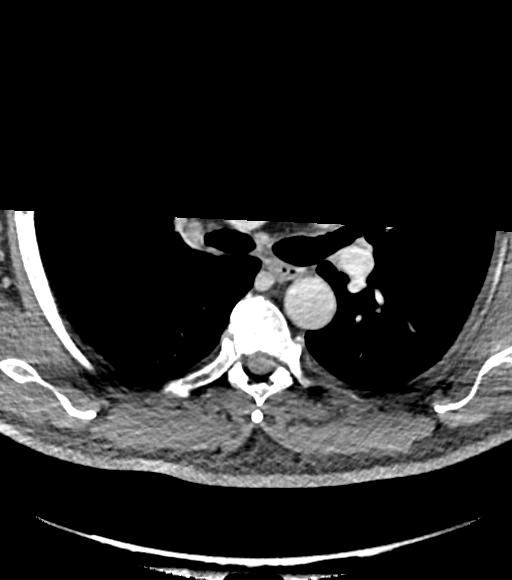
[im 40/199  bone]
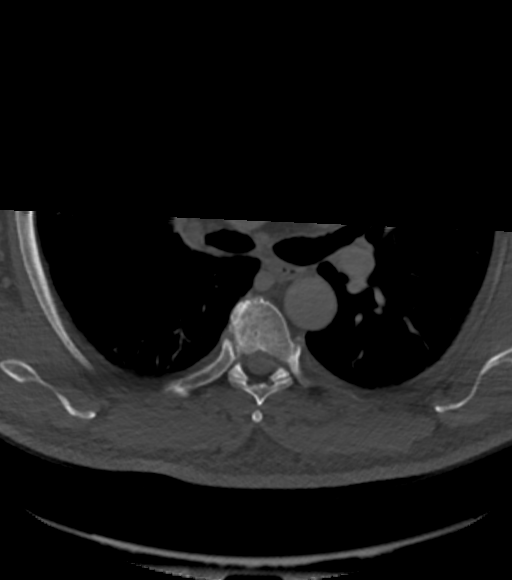
[im 80/199  bone]
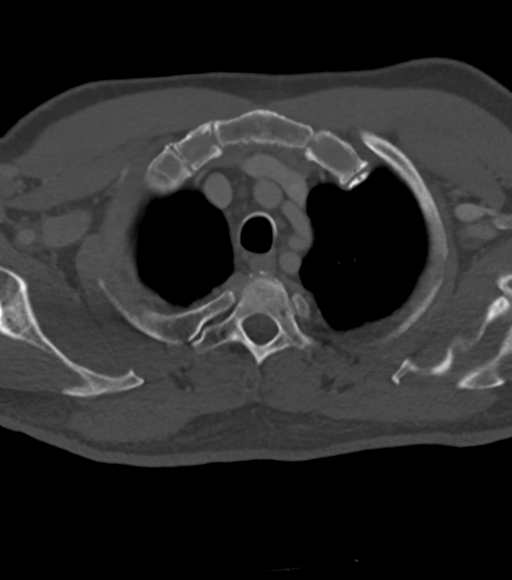
[im 119/199  bone]
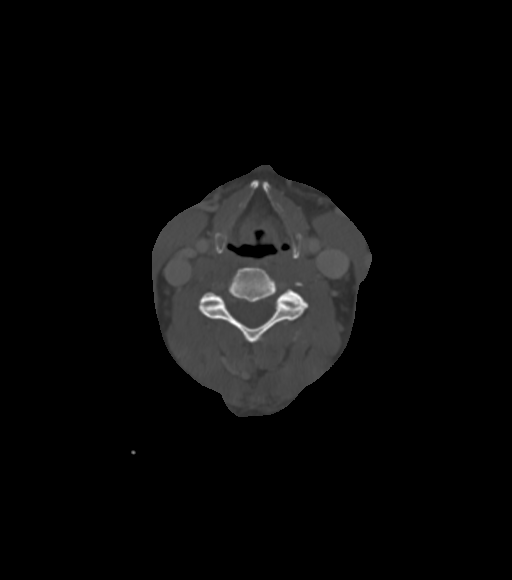
[im 159/199  bone]
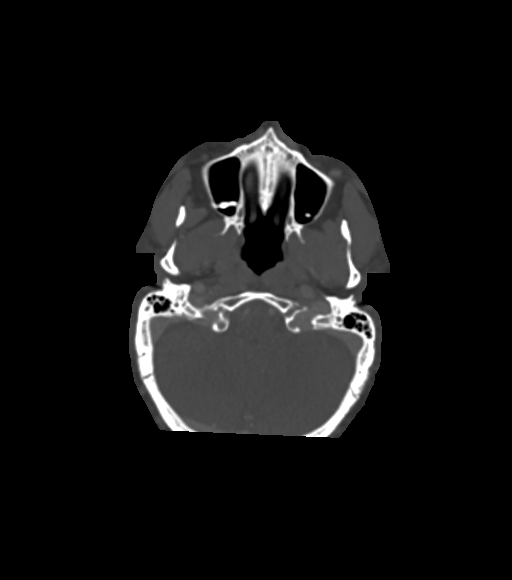

[Series 8: sagittal neck neck (person_name) · sagittal · 0.63mm/px · 5 of 141 slices shown, 6 images]
[im 47/141  bone]
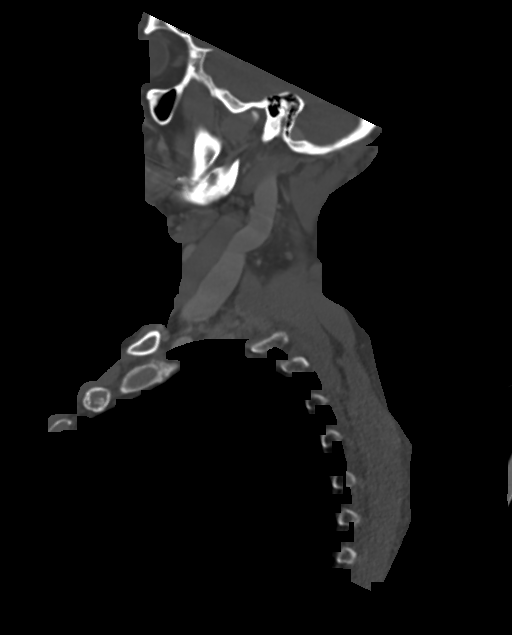
[im 59/141  bone]
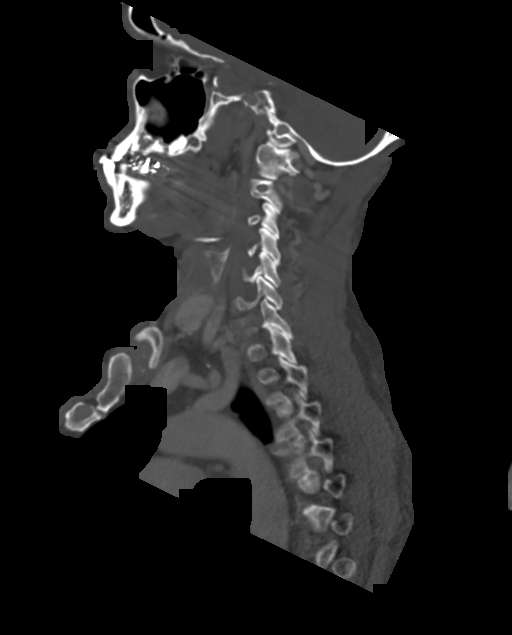
[im 71/141  soft-tissue]
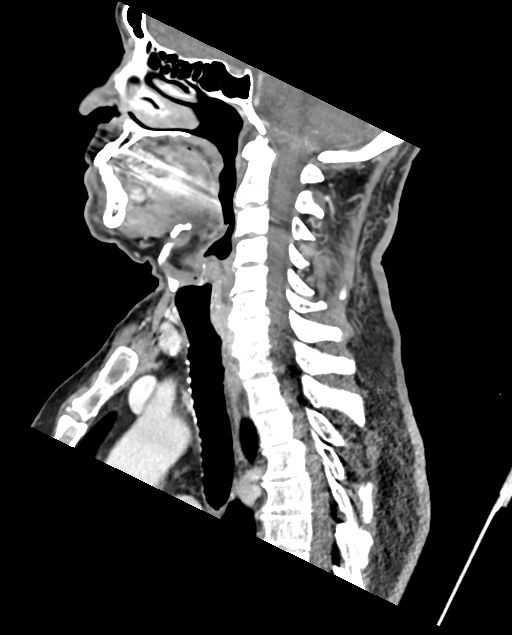
[im 71/141  bone]
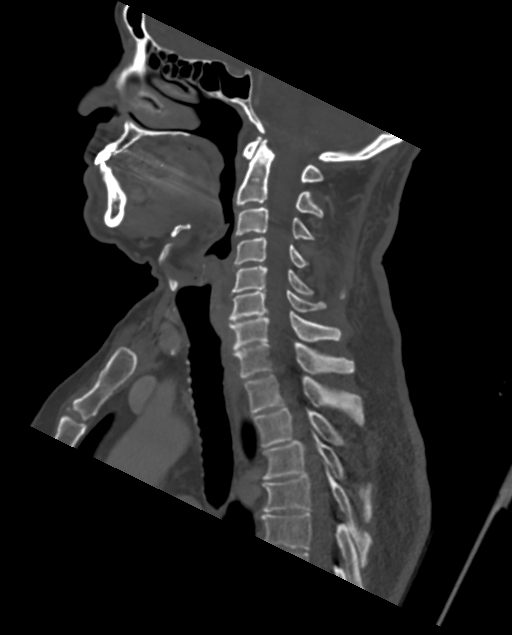
[im 82/141  bone]
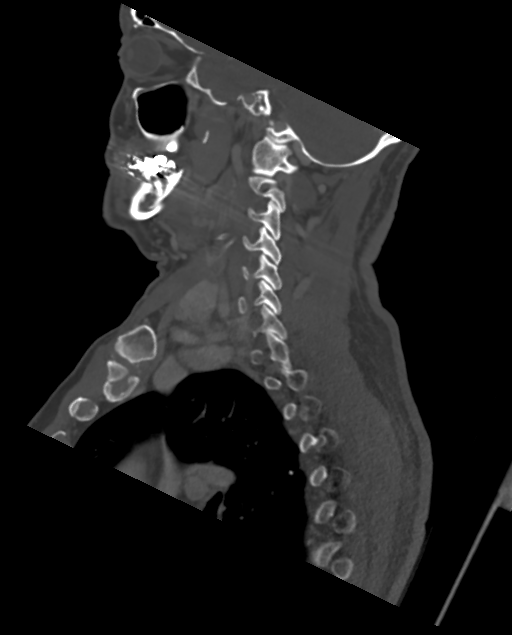
[im 94/141  bone]
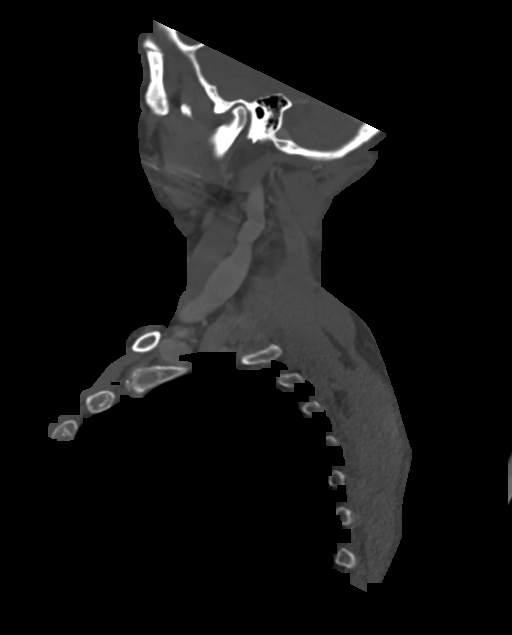

[14 of 33 positions shown; findings below may reference images not displayed]

FINDINGS: Pharynx and larynx: No mucosal or submucosal lesion is seen.

Salivary glands: Parotid and submandibular glands are normal.

Thyroid: Normal

Lymph nodes: No enlarged or low-density nodes on either side of the
neck.

Vascular: Carotid and vertebral arteries appear unremarkable. There
is only mild atherosclerotic change at the aortic arch. Carotid
bifurcation regions show mild atherosclerotic calcification but no
stenosis or irregularity. Cervical internal carotid arteries appear
normal.

Limited intracranial: Normal

Visualized orbits: Normal

Mastoids and visualized paranasal sinuses: Clear

Skeleton: Ordinary cervical spondylosis.

Upper chest: Mild scarring at the lung apices. No active or
significant finding.

Other: None
IMPRESSION: Negative CT scan of the neck. Minimal atherosclerosis of the aorta
and carotid bifurcations. Mild apical pleural and parenchymal
scarring. Ordinary cervical spondylosis.

## 2019-07-10 IMAGING — CT CT HEAD WITHOUT AND WITH CONTRAST
4 of 5 series · 16 of 47 positions shown, 18 images · IV contrast (omnipaque)
Comparison: None.

CLINICAL DATA: Right-sided headache over the last week, worse with
exertion.

EXAM:
CT HEAD WITHOUT AND WITH CONTRAST
TECHNIQUE: Contiguous axial images were obtained from the base of the skull
through the vertex without and with intravenous contrast
CONTRAST:  75mL OMNIPAQUE IOHEXOL 300 MG/ML  SOLN

[Series 2: brain · axial · 0.43mm/px · z∈[-643,-523]mm · 8 of 32 slices shown, 10 images (1 of 4)]
[im 4/32  brain]
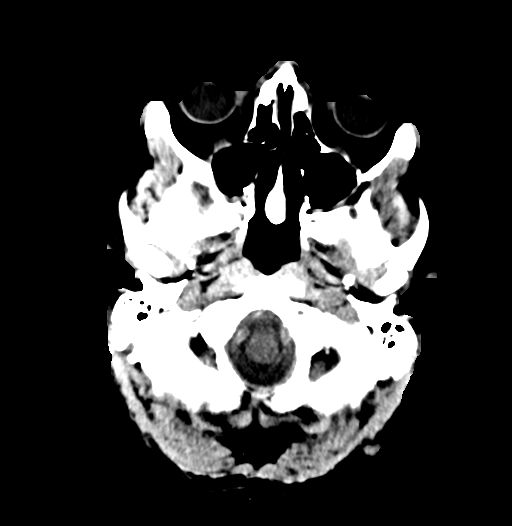
[im 4/32  bone]
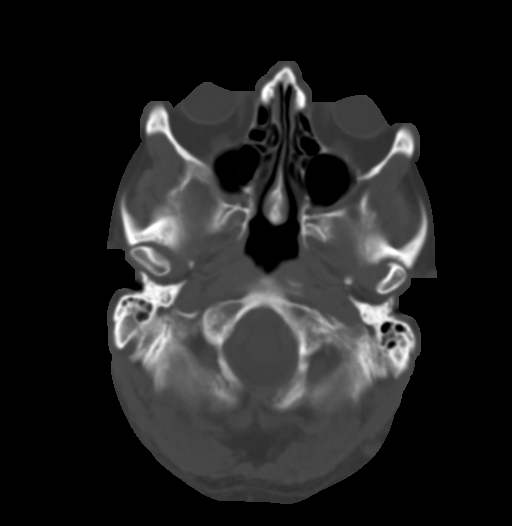
[im 7/32  brain]
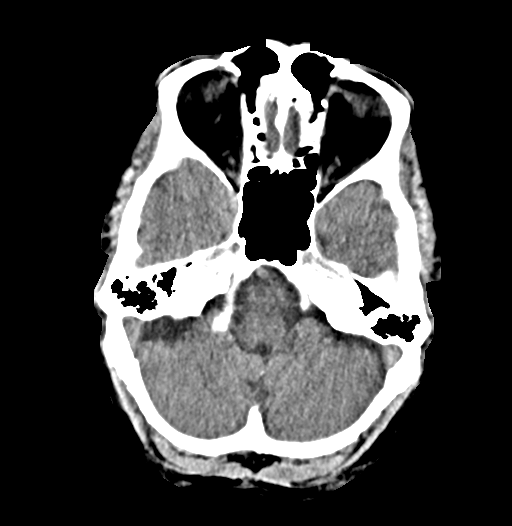
[im 11/32  brain]
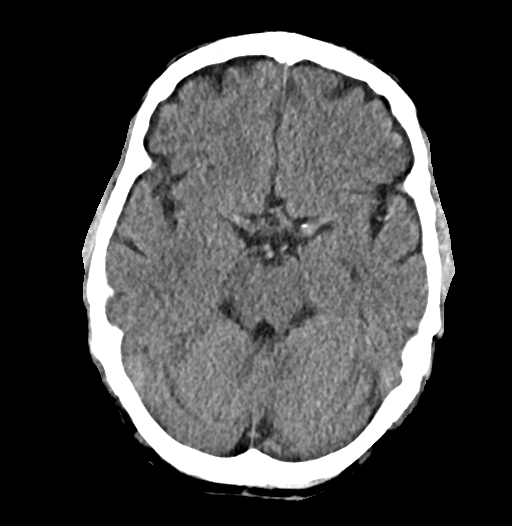
[im 14/32  brain]
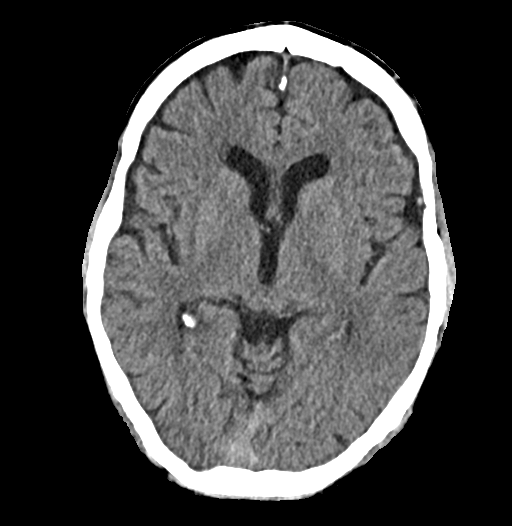
[im 18/32  brain]
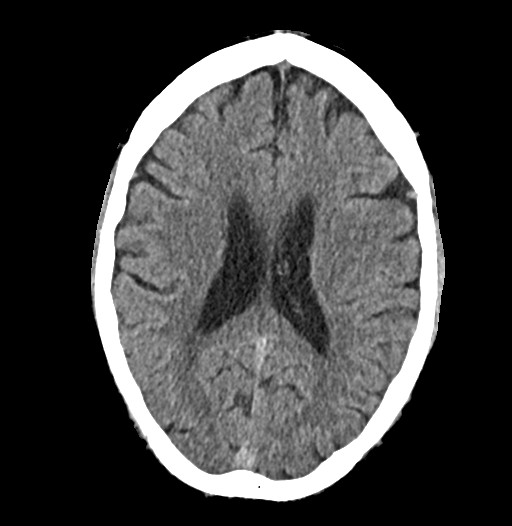
[im 18/32  bone]
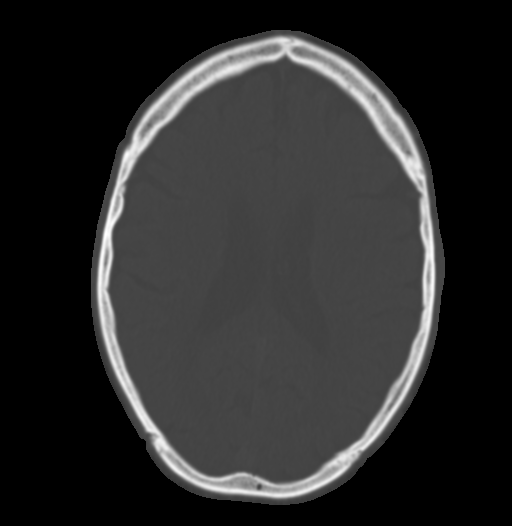
[im 21/32  brain]
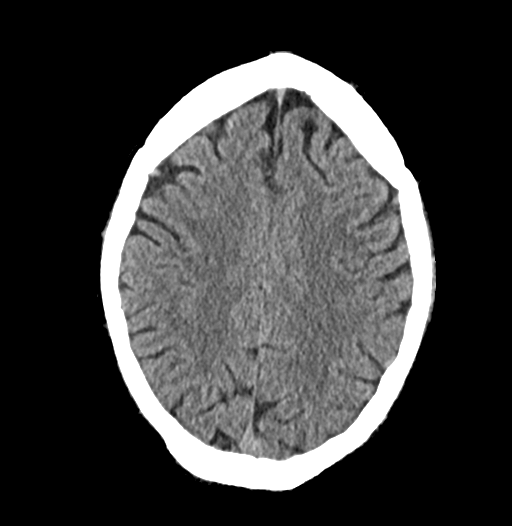
[im 25/32  brain]
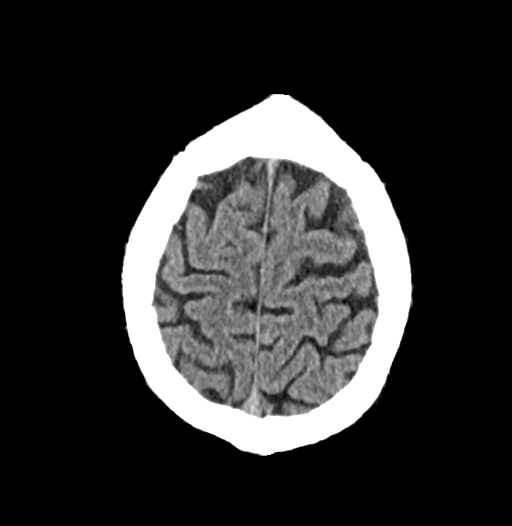
[im 28/32  brain]
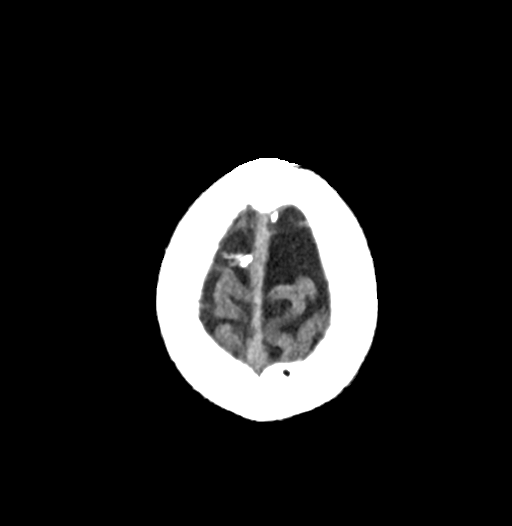

[Series 4: brain · axial · 0.43mm/px · z∈[-643,-628]mm · 2 of 32 slices shown (2 of 4)]
[im 4/32  brain]
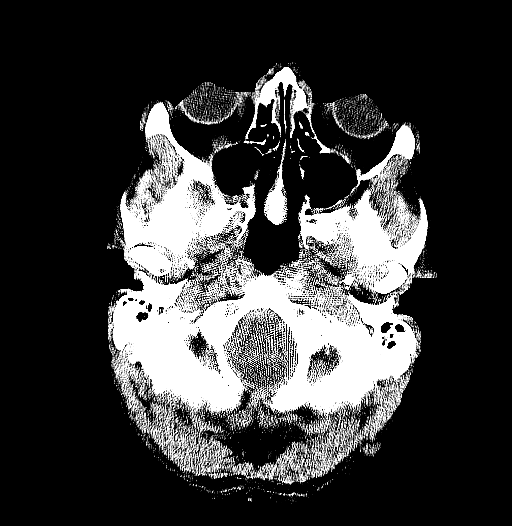
[im 7/32  brain]
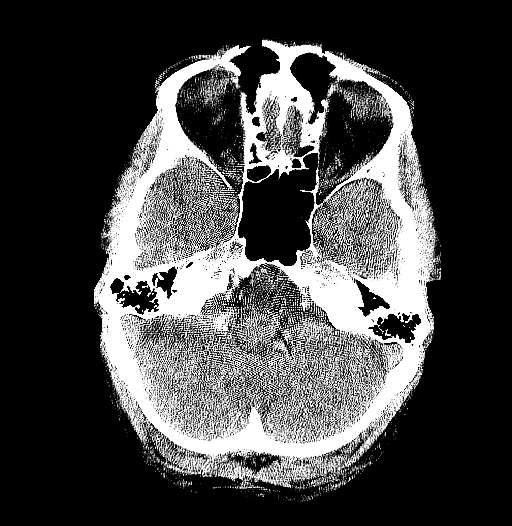

[Series 9: brain · coronal · 0.34mm/px · 3 of 79 slices shown (3 of 4)]
[im 27/79  brain]
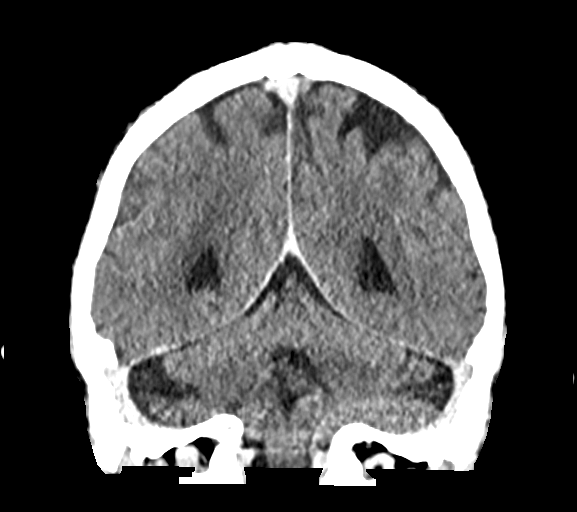
[im 35/79  brain]
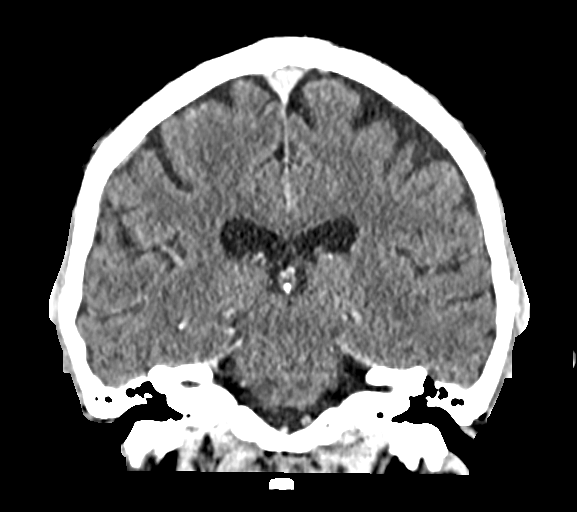
[im 44/79  brain]
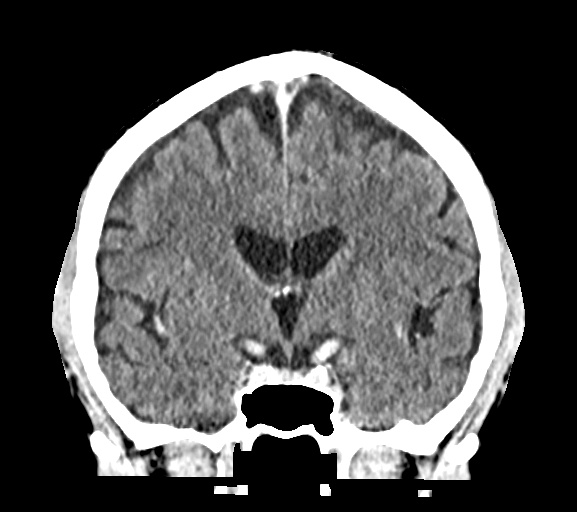

[Series 11: brain · sagittal · 0.34mm/px · 3 of 65 slices shown (4 of 4)]
[im 22/65  brain]
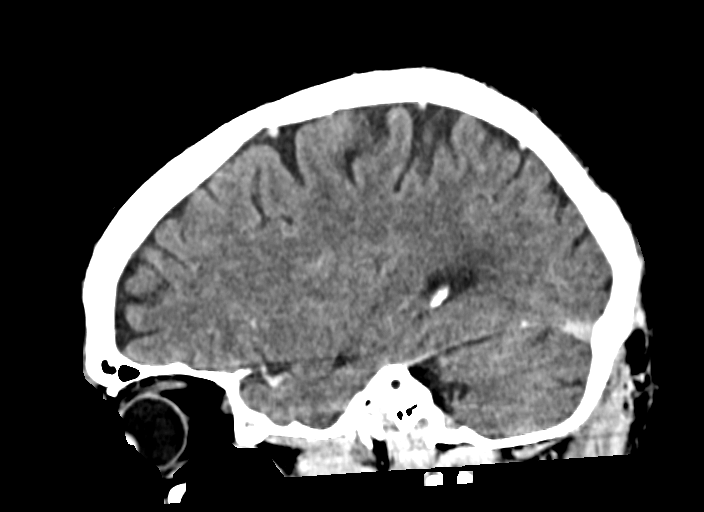
[im 33/65  brain]
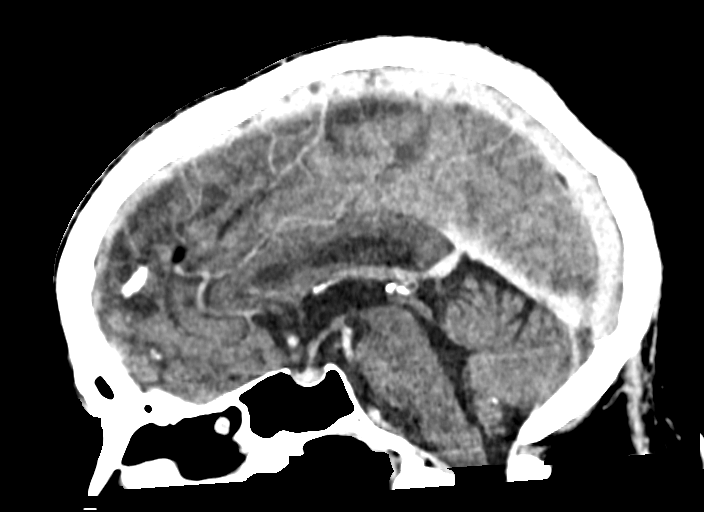
[im 43/65  brain]
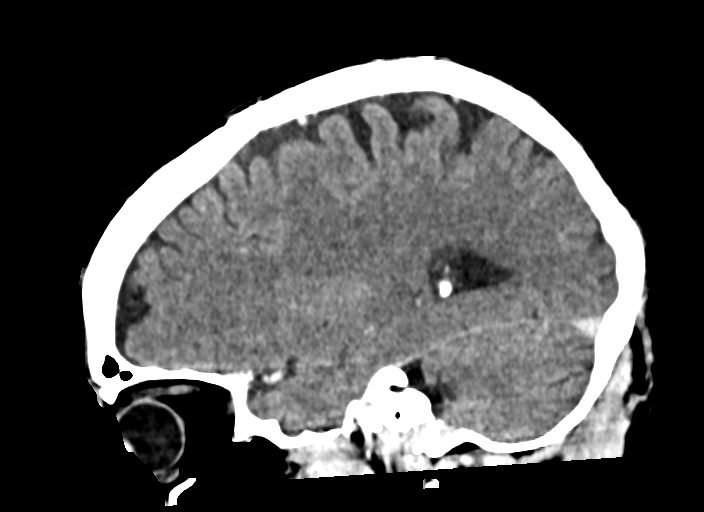

[16 of 47 positions shown; findings below may reference images not displayed]

FINDINGS: Brain: The brain shows a normal appearance without evidence of
malformation, atrophy, old or acute small or large vessel
infarction, mass lesion, hemorrhage, hydrocephalus or extra-axial
collection. After contrast administration, no abnormal enhancement
occurs.

Vascular: No hyperdense vessel. No evidence of atherosclerotic
calcification.

Skull: Normal.  No traumatic finding.  No focal bone lesion.

Sinuses/Orbits: Sinuses are clear. Orbits appear normal. Mastoids
are clear.

Other: None significant
IMPRESSION: Normal head CT. No abnormality seen to explain the presenting
symptoms.

## 2020-03-31 DIAGNOSIS — G5601 Carpal tunnel syndrome, right upper limb: Secondary | ICD-10-CM | POA: Insufficient documentation

## 2021-07-21 ENCOUNTER — Other Ambulatory Visit
Admission: RE | Admit: 2021-07-21 | Discharge: 2021-07-21 | Disposition: A | Discharge status: Home / Self Care | Payer: Medicare PPO | Source: Ambulatory Visit | Attending: Ophthalmology | Admitting: Ophthalmology

## 2021-07-21 DIAGNOSIS — R519 Headache, unspecified: Secondary | ICD-10-CM | POA: Diagnosis present

## 2021-07-21 LAB — COMPREHENSIVE METABOLIC PANEL
ALT: 28 U/L (ref 0–44)
AST: 30 U/L (ref 15–41)
Albumin: 4.5 g/dL (ref 3.5–5.0)
Alkaline Phosphatase: 58 U/L (ref 38–126)
Anion gap: 9 (ref 5–15)
BUN: 31 mg/dL — ABNORMAL HIGH (ref 8–23)
CO2: 24 mmol/L (ref 22–32)
Calcium: 9 mg/dL (ref 8.9–10.3)
Chloride: 104 mmol/L (ref 98–111)
Creatinine, Ser: 1.42 mg/dL — ABNORMAL HIGH (ref 0.61–1.24)
GFR, Estimated: 50 mL/min — ABNORMAL LOW (ref >60)
Glucose, Bld: 111 mg/dL — ABNORMAL HIGH (ref 70–99)
Potassium: 4.2 mmol/L (ref 3.5–5.1)
Sodium: 137 mmol/L (ref 135–145)
Total Bilirubin: 0.8 mg/dL (ref 0.3–1.2)
Total Protein: 6.9 g/dL (ref 6.5–8.1)

## 2021-07-21 LAB — SEDIMENTATION RATE: Sed Rate: 3 mm/hr (ref 0–20)

## 2021-07-21 LAB — C-REACTIVE PROTEIN: CRP: 0.7 mg/dL (ref <1.0)

## 2022-06-08 ENCOUNTER — Encounter: Payer: Self-pay | Admitting: Ophthalmology

## 2022-06-08 NOTE — Discharge Instructions (Signed)

## 2022-06-13 ENCOUNTER — Ambulatory Visit
Admission: RE | Admit: 2022-06-13 | Discharge: 2022-06-13 | Disposition: A | Discharge status: Home / Self Care | Payer: Medicare PPO | Attending: Ophthalmology | Admitting: Ophthalmology

## 2022-06-13 ENCOUNTER — Other Ambulatory Visit: Payer: Self-pay

## 2022-06-13 ENCOUNTER — Encounter
Admission: RE | Disposition: A | Discharge status: Home / Self Care | Payer: Self-pay | Source: Home / Self Care | Attending: Ophthalmology

## 2022-06-13 ENCOUNTER — Ambulatory Visit: Payer: Medicare PPO | Admitting: Anesthesiology

## 2022-06-13 ENCOUNTER — Encounter: Payer: Self-pay | Admitting: Ophthalmology

## 2022-06-13 DIAGNOSIS — G4733 Obstructive sleep apnea (adult) (pediatric): Secondary | ICD-10-CM | POA: Insufficient documentation

## 2022-06-13 DIAGNOSIS — H2512 Age-related nuclear cataract, left eye: Secondary | ICD-10-CM | POA: Diagnosis present

## 2022-06-13 DIAGNOSIS — K219 Gastro-esophageal reflux disease without esophagitis: Secondary | ICD-10-CM | POA: Insufficient documentation

## 2022-06-13 DIAGNOSIS — I10 Essential (primary) hypertension: Secondary | ICD-10-CM | POA: Diagnosis not present

## 2022-06-13 HISTORY — PX: CATARACT EXTRACTION W/PHACO: SHX586

## 2022-06-13 SURGERY — PHACOEMULSIFICATION, CATARACT, WITH IOL INSERTION
Anesthesia: Monitor Anesthesia Care | Laterality: Left

## 2022-06-13 MED ORDER — SIGHTPATH DOSE#1 NA CHONDROIT SULF-NA HYALURON 40-17 MG/ML IO SOLN
INTRAOCULAR | Status: DC | PRN
  Administered 2022-06-13: 1 mL via INTRAOCULAR

## 2022-06-13 MED ORDER — TETRACAINE HCL 0.5 % OP SOLN
1.0000 [drp] | OPHTHALMIC | Status: DC | PRN
  Administered 2022-06-13 (×3): 1 [drp] via OPHTHALMIC

## 2022-06-13 MED ORDER — SIGHTPATH DOSE#1 BSS IO SOLN
INTRAOCULAR | Status: DC | PRN
  Administered 2022-06-13: 2 mL

## 2022-06-13 MED ORDER — ARMC OPHTHALMIC DILATING DROPS
1.0000 | OPHTHALMIC | Status: DC | PRN
  Administered 2022-06-13 (×3): 1 via OPHTHALMIC

## 2022-06-13 MED ORDER — SIGHTPATH DOSE#1 BSS IO SOLN
INTRAOCULAR | Status: DC | PRN
  Administered 2022-06-13: 15 mL via INTRAOCULAR

## 2022-06-13 MED ORDER — MIDAZOLAM HCL 2 MG/2ML IJ SOLN
INTRAMUSCULAR | Status: DC | PRN
  Administered 2022-06-13 (×2): 1 mg via INTRAVENOUS

## 2022-06-13 MED ORDER — MOXIFLOXACIN HCL 0.5 % OP SOLN
OPHTHALMIC | Status: DC | PRN
  Administered 2022-06-13: .2 mL via OPHTHALMIC

## 2022-06-13 MED ORDER — FENTANYL CITRATE (PF) 100 MCG/2ML IJ SOLN
INTRAMUSCULAR | Status: DC | PRN
  Administered 2022-06-13: 100 ug via INTRAVENOUS

## 2022-06-13 MED ORDER — SIGHTPATH DOSE#1 BSS IO SOLN
INTRAOCULAR | Status: DC | PRN
  Administered 2022-06-13: 59 mL via OPHTHALMIC

## 2022-06-13 SURGICAL SUPPLY — 9 items
CATARACT SUITE SIGHTPATH (MISCELLANEOUS) ×1 IMPLANT
FEE CATARACT SUITE SIGHTPATH (MISCELLANEOUS) ×1 IMPLANT
GLOVE BIOGEL PI IND STRL 8 (GLOVE) ×1 IMPLANT
GLOVE SURG ENC TEXT LTX SZ8 (GLOVE) ×1 IMPLANT
LENS IOL TECNIS EYHANCE 20.5 (Intraocular Lens) IMPLANT
NDL FILTER BLUNT 18X1 1/2 (NEEDLE) ×1 IMPLANT
NEEDLE FILTER BLUNT 18X1 1/2 (NEEDLE) ×1 IMPLANT
SYR 3ML LL SCALE MARK (SYRINGE) ×1 IMPLANT
WATER STERILE IRR 250ML POUR (IV SOLUTION) ×1 IMPLANT

## 2022-06-13 NOTE — Anesthesia Postprocedure Evaluation (Signed)
Anesthesia Post Note  Patient: Calvin Wallace  Procedure(s) Performed: CATARACT EXTRACTION PHACO AND INTRAOCULAR LENS PLACEMENT (IOC) LEFT 5.53 0:39.9 (Left)  Patient location during evaluation: PACU Anesthesia Type: MAC Level of consciousness: awake and alert Pain management: pain level controlled Vital Signs Assessment: post-procedure vital signs reviewed and stable Respiratory status: spontaneous breathing, nonlabored ventilation, respiratory function stable and patient connected to nasal cannula oxygen Cardiovascular status: stable and blood pressure returned to baseline Postop Assessment: no apparent nausea or vomiting Anesthetic complications: no   No notable events documented.   Last Vitals:  Vitals:   06/13/22 1046 06/13/22 1052  BP: 139/89 131/87  Pulse: 84 69  Resp: 10 12  Temp: (!) 36.3 C (!) 36.3 C  SpO2: 95% 94%    Last Pain:  Vitals:   06/13/22 1052  PainSc: 0-No pain                 Ilene Qua

## 2022-06-13 NOTE — Transfer of Care (Signed)
Immediate Anesthesia Transfer of Care Note  Patient: Calvin Wallace  Procedure(s) Performed: CATARACT EXTRACTION PHACO AND INTRAOCULAR LENS PLACEMENT (IOC) LEFT 5.53 0:39.9 (Left)  Patient Location: PACU  Anesthesia Type: MAC  Level of Consciousness: awake, alert  and patient cooperative  Airway and Oxygen Therapy: Patient Spontanous Breathing and Patient connected to supplemental oxygen  Post-op Assessment: Post-op Vital signs reviewed, Patient's Cardiovascular Status Stable, Respiratory Function Stable, Patent Airway and No signs of Nausea or vomiting  Post-op Vital Signs: Reviewed and stable  Complications: No notable events documented.

## 2022-06-13 NOTE — Op Note (Signed)
PREOPERATIVE DIAGNOSIS:  Nuclear sclerotic cataract of the left eye.   POSTOPERATIVE DIAGNOSIS:  Nuclear sclerotic cataract of the left eye.   OPERATIVE PROCEDURE:ORPROCALL@   SURGEON:  Birder Robson, MD.   ANESTHESIA:  Anesthesiologist: Ilene Qua, MD CRNA: Tobie Poet, CRNA  1.      Managed anesthesia care. 2.     0.70m of Shugarcaine was instilled following the paracentesis   COMPLICATIONS:  None.   TECHNIQUE:   Stop and chop   DESCRIPTION OF PROCEDURE:  The patient was examined and consented in the preoperative holding area where the aforementioned topical anesthesia was applied to the left eye and then brought back to the Operating Room where the left eye was prepped and draped in the usual sterile ophthalmic fashion and a lid speculum was placed. A paracentesis was created with the side port blade and the anterior chamber was filled with viscoelastic. A near clear corneal incision was performed with the steel keratome. A continuous curvilinear capsulorrhexis was performed with a cystotome followed by the capsulorrhexis forceps. Hydrodissection and hydrodelineation were carried out with BSS on a blunt cannula. The lens was removed in a stop and chop  technique and the remaining cortical material was removed with the irrigation-aspiration handpiece. The capsular bag was inflated with viscoelastic and the Technis [id number] lens was placed in the capsular bag without complication. The remaining viscoelastic was removed from the eye with the irrigation-aspiration handpiece. The wounds were hydrated. The anterior chamber was flushed with BSS and the eye was inflated to physiologic pressure. 0.169mVigamox was placed in the anterior chamber. The wounds were found to be water tight. The eye was dressed with Combigan. The patient was given protective glasses to wear throughout the day and a shield with which to sleep tonight. The patient was also given drops with which to begin a drop  regimen today and will follow-up with me in one day. Implant Name Type Inv. Item Serial No. Manufacturer Lot No. LRB No. Used Action  LENS IOL TECNIS EYHANCE 20.5 - [id number] Lens LENS IOL TECNIS EYHANCE 20.5 [id number]  Left 1 Implanted    Procedure(s): CATARACT EXTRACTION PHACO AND INTRAOCULAR LENS PLACEMENT (IOC) LEFT 5.53 0:39.9 (Left)  Electronically signed: WiBirder Robson/13/2024 10:47 AM

## 2022-06-13 NOTE — H&P (Signed)
[place]   Primary Care Physician:  Juluis Pitch, MD Ophthalmologist: Dr. George Ina  Pre-Procedure History & Physical: HPI:  Calvin Wallace is a 82 y.o. male here for cataract surgery.   Past Medical History:  Diagnosis Date   Arthritis    GERD (gastroesophageal reflux disease)    Headache    migraines   History of kidney stones    Hypercholesteremia    Hyperlipidemia    Hypertension    Migraines    Sleep apnea    OSA---USE C-PAP   Umbilical hernia     Past Surgical History:  Procedure Laterality Date   BUNIONECTOMY Left    CARDIAC CATHETERIZATION     COLONOSCOPY     COLONOSCOPY WITH PROPOFOL N/A 07/20/2017   Procedure: COLONOSCOPY WITH PROPOFOL;  Surgeon: Manya Silvas, MD;  Location: [facility] ENDOSCOPY;  Service: Endoscopy;  Laterality: N/A;   ESOPHAGOGASTRODUODENOSCOPY (EGD) WITH PROPOFOL N/A 07/20/2017   Procedure: ESOPHAGOGASTRODUODENOSCOPY (EGD) WITH PROPOFOL;  Surgeon: Manya Silvas, MD;  Location: [facility] ENDOSCOPY;  Service: Endoscopy;  Laterality: N/A;   HERNIA REPAIR Right    Inguinal Hernia Repair   HERNIA REPAIR     Umbilical Hernia Repair   KNEE ARTHROSCOPY Right 09/21/2016   Procedure: ARTHROSCOPY KNEE MEDIAL MENISCECTOMY;  Surgeon: Hessie Knows, MD;  Location: [facility] ORS;  Service: Orthopedics;  Laterality: Right;   LITHOTRIPSY     SHOULDER ARTHROSCOPY WITH ROTATOR CUFF REPAIR Right 1998   TONSILLECTOMY     Age 25   UMBILICAL HERNIA REPAIR      Prior to Admission medications   Medication Sig Start Date End Date Taking? Authorizing Provider  amLODipine (NORVASC) 5 MG tablet Take 5 mg by mouth daily.   Yes [provider]  aspirin EC 81 MG tablet Take 81 mg by mouth daily.   Yes [provider]  diclofenac sodium (VOLTAREN) 1 % GEL Apply 1 application topically 2 (two) times daily as needed (pain).   Yes [provider]  doxepin (SINEQUAN) 10 MG capsule Take 10 mg by mouth at bedtime.   Yes [provider]   finasteride (PROSCAR) 5 MG tablet Take 5 mg by mouth every evening.   Yes [provider]  lisinopril (PRINIVIL,ZESTRIL) 40 MG tablet Take 40 mg by mouth every evening.   Yes [provider]  omeprazole (PRILOSEC) 20 MG capsule Take 20 mg by mouth daily.   Yes [provider]  pravastatin (PRAVACHOL) 40 MG tablet Take 40 mg by mouth every evening.   Yes [provider]  SUMAtriptan (IMITREX) 6 MG/0.5ML SOLN injection Inject 6 mg into the skin every 2 (two) hours as needed for migraine or headache. May repeat in 2 hours if headache persists or recurs.   Yes [provider]  desonide (DESOWEN) 0.05 % cream Apply topically as needed.    [provider]  ketoconazole (NIZORAL) 2 % cream Apply 1 application topically once a week.    [provider]    Allergies as of 04/28/2022 - Review Complete 09/13/2018  Allergen Reaction Noted   Amino acids Other (See Comments) 09/11/2016   Cheese Other (See Comments) 07/19/2017    Family History  Problem Relation Age of Onset   Breast cancer Mother    Diabetes Father    Hypertension Father    Stroke Father    Prostate cancer Father    Colon polyps Sister     Social History   Socioeconomic History   Marital status: Married  Spouse name: Not on file   Number of children: Not on file   Years of education: Not on file   Highest education level: Not on file  Occupational History   Not on file  Tobacco Use   Smoking status: Never   Smokeless tobacco: Never  Vaping Use   Vaping Use: Never used  Substance and Sexual Activity   Alcohol use: No   Drug use: No   Sexual activity: Not on file    Comment: Married   Other Topics Concern   Not on file  Social History Narrative   Not on file   Social Determinants of Health   Financial Resource Strain: Not on file  Food Insecurity: Not on file  Transportation Needs: Not on file  Physical Activity: Not on file  Stress: Not on file   Social Connections: Not on file  Intimate Partner Violence: Not on file    Review of Systems: See HPI, otherwise negative ROS  Physical Exam: BP 133/86   Pulse 74   Temp (!) 97.4 F (36.3 C)   Ht 5' 11"$  (1.803 m)   Wt 88 kg   SpO2 98%   BMI 27.06 kg/m  General:   Alert, cooperative in NAD Head:  Normocephalic and atraumatic. Respiratory:  Normal work of breathing. Cardiovascular:  RRR  Impression/Plan: Calvin Wallace is here for cataract surgery.  Risks, benefits, limitations, and alternatives regarding cataract surgery have been reviewed with the patient.  Questions have been answered.  All parties agreeable.   Birder Robson, MD  06/13/2022, 10:22 AM

## 2022-06-13 NOTE — Anesthesia Preprocedure Evaluation (Signed)
Anesthesia Evaluation  Patient identified by MRN, date of birth, ID band Patient awake    Reviewed: Allergy & Precautions, NPO status , Patient's Chart, lab work & pertinent test results  Airway Mallampati: III  TM Distance: >3 FB Neck ROM: full    Dental no notable dental hx.    Pulmonary sleep apnea and Continuous Positive Airway Pressure Ventilation    Pulmonary exam normal        Cardiovascular hypertension, negative cardio ROS Normal cardiovascular exam     Neuro/Psych  Headaches  negative psych ROS   GI/Hepatic Neg liver ROS,GERD  Medicated,,  Endo/Other  negative endocrine ROS    Renal/GU      Musculoskeletal   Abdominal   Peds  Hematology negative hematology ROS (+)   Anesthesia Other Findings Past Medical History: No date: Arthritis No date: GERD (gastroesophageal reflux disease) No date: Headache     Comment:  migraines No date: History of kidney stones No date: Hypercholesteremia No date: Hyperlipidemia No date: Hypertension No date: Migraines No date: Sleep apnea     Comment:  OSA---USE C-PAP No date: Umbilical hernia  Past Surgical History: No date: BUNIONECTOMY; Left No date: CARDIAC CATHETERIZATION No date: COLONOSCOPY 07/20/2017: COLONOSCOPY WITH PROPOFOL; N/A     Comment:  Procedure: COLONOSCOPY WITH PROPOFOL;  Surgeon: Manya Silvas, MD;  Location: [facility] ENDOSCOPY;  Service:               Endoscopy;  Laterality: N/A; 07/20/2017: ESOPHAGOGASTRODUODENOSCOPY (EGD) WITH PROPOFOL; N/A     Comment:  Procedure: ESOPHAGOGASTRODUODENOSCOPY (EGD) WITH               PROPOFOL;  Surgeon: Manya Silvas, MD;  Location:               [facility] ENDOSCOPY;  Service: Endoscopy;  Laterality: N/A; No date: HERNIA REPAIR; Right     Comment:  Inguinal Hernia Repair No date: HERNIA REPAIR     Comment:  Umbilical Hernia Repair [id number]: KNEE ARTHROSCOPY; Right     Comment:  Procedure:  ARTHROSCOPY KNEE MEDIAL MENISCECTOMY;                Surgeon: Hessie Knows, MD;  Location: [facility] ORS;                Service: Orthopedics;  Laterality: Right; No date: LITHOTRIPSY 1998: SHOULDER ARTHROSCOPY WITH ROTATOR CUFF REPAIR; Right No date: TONSILLECTOMY     Comment:  Age 82 No date: UMBILICAL HERNIA REPAIR  BMI    Body Mass Index: 26.78 kg/m      Reproductive/Obstetrics negative OB ROS                              Anesthesia Physical Anesthesia Plan  ASA: 3  Anesthesia Plan: MAC   Post-op Pain Management:    Induction: Intravenous  PONV Risk Score and Plan:   Airway Management Planned: Natural Airway and Nasal Cannula  Additional Equipment:   Intra-op Plan:   Post-operative Plan:   Informed Consent: I have reviewed the patients History and Physical, chart, labs and discussed the procedure including the risks, benefits and alternatives for the proposed anesthesia with the patient or authorized representative who has indicated his/her understanding and acceptance.     Dental Advisory Given  Plan Discussed with: Anesthesiologist, CRNA and Surgeon  Anesthesia Plan Comments: (Patient consented for risks  of anesthesia including but not limited to:  - adverse reactions to medications - damage to eyes, teeth, lips or other oral mucosa - nerve damage due to positioning  - sore throat or hoarseness - Damage to heart, brain, nerves, lungs, other parts of body or loss of life  Patient voiced understanding.)         Anesthesia Quick Evaluation

## 2022-06-14 ENCOUNTER — Encounter: Payer: Self-pay | Admitting: Ophthalmology

## 2022-06-22 ENCOUNTER — Encounter: Payer: Self-pay | Admitting: Ophthalmology

## 2022-06-27 ENCOUNTER — Ambulatory Visit: Payer: Medicare PPO | Admitting: General Practice

## 2022-06-27 ENCOUNTER — Ambulatory Visit
Admission: RE | Admit: 2022-06-27 | Discharge: 2022-06-27 | Disposition: A | Discharge status: Home / Self Care | Payer: Medicare PPO | Attending: Ophthalmology | Admitting: Ophthalmology

## 2022-06-27 ENCOUNTER — Encounter: Payer: Self-pay | Admitting: Ophthalmology

## 2022-06-27 ENCOUNTER — Other Ambulatory Visit: Payer: Self-pay

## 2022-06-27 ENCOUNTER — Encounter
Admission: RE | Disposition: A | Discharge status: Home / Self Care | Payer: Self-pay | Source: Home / Self Care | Attending: Ophthalmology

## 2022-06-27 DIAGNOSIS — G4733 Obstructive sleep apnea (adult) (pediatric): Secondary | ICD-10-CM | POA: Insufficient documentation

## 2022-06-27 DIAGNOSIS — I10 Essential (primary) hypertension: Secondary | ICD-10-CM | POA: Insufficient documentation

## 2022-06-27 DIAGNOSIS — H2511 Age-related nuclear cataract, right eye: Secondary | ICD-10-CM | POA: Insufficient documentation

## 2022-06-27 DIAGNOSIS — E785 Hyperlipidemia, unspecified: Secondary | ICD-10-CM | POA: Diagnosis not present

## 2022-06-27 HISTORY — PX: CATARACT EXTRACTION W/PHACO: SHX586

## 2022-06-27 SURGERY — PHACOEMULSIFICATION, CATARACT, WITH IOL INSERTION
Anesthesia: Monitor Anesthesia Care | Laterality: Right

## 2022-06-27 MED ORDER — MOXIFLOXACIN HCL 0.5 % OP SOLN
OPHTHALMIC | Status: DC | PRN
  Administered 2022-06-27: .2 mL via OPHTHALMIC

## 2022-06-27 MED ORDER — SIGHTPATH DOSE#1 BSS IO SOLN
INTRAOCULAR | Status: DC | PRN
  Administered 2022-06-27: 2 mL

## 2022-06-27 MED ORDER — TETRACAINE HCL 0.5 % OP SOLN
1.0000 [drp] | OPHTHALMIC | Status: DC | PRN
  Administered 2022-06-27 (×3): 1 [drp] via OPHTHALMIC

## 2022-06-27 MED ORDER — MIDAZOLAM HCL 2 MG/2ML IJ SOLN
INTRAMUSCULAR | Status: DC | PRN
  Administered 2022-06-27: 2 mg via INTRAVENOUS

## 2022-06-27 MED ORDER — FENTANYL CITRATE (PF) 100 MCG/2ML IJ SOLN
INTRAMUSCULAR | Status: DC | PRN
  Administered 2022-06-27: 100 ug via INTRAVENOUS

## 2022-06-27 MED ORDER — SIGHTPATH DOSE#1 NA CHONDROIT SULF-NA HYALURON 40-17 MG/ML IO SOLN
INTRAOCULAR | Status: DC | PRN
  Administered 2022-06-27: 1 mL via INTRAOCULAR

## 2022-06-27 MED ORDER — ARMC OPHTHALMIC DILATING DROPS
1.0000 | OPHTHALMIC | Status: DC | PRN
  Administered 2022-06-27 (×3): 1 via OPHTHALMIC

## 2022-06-27 MED ORDER — PHENYLEPHRINE-KETOROLAC 1-0.3 % IO SOLN
INTRAOCULAR | Status: DC | PRN
  Administered 2022-06-27: 45 mL via OPHTHALMIC

## 2022-06-27 MED ORDER — SIGHTPATH DOSE#1 BSS IO SOLN
INTRAOCULAR | Status: DC | PRN
  Administered 2022-06-27: 15 mL via INTRAOCULAR

## 2022-06-27 MED ORDER — BRIMONIDINE TARTRATE-TIMOLOL 0.2-0.5 % OP SOLN
OPHTHALMIC | Status: DC | PRN
  Administered 2022-06-27: 1 [drp] via OPHTHALMIC

## 2022-06-27 SURGICAL SUPPLY — 15 items
CANNULA ANT/CHMB 27G (MISCELLANEOUS) IMPLANT
CANNULA ANT/CHMB 27GA (MISCELLANEOUS) IMPLANT
CATARACT SUITE SIGHTPATH (MISCELLANEOUS) ×1 IMPLANT
FEE CATARACT SUITE SIGHTPATH (MISCELLANEOUS) ×1 IMPLANT
GLOVE BIOGEL PI IND STRL 8 (GLOVE) ×1 IMPLANT
GLOVE SURG ENC TEXT LTX SZ8 (GLOVE) ×1 IMPLANT
LENS IOL TECNIS EYHANCE 20.5 (Intraocular Lens) IMPLANT
NDL FILTER BLUNT 18X1 1/2 (NEEDLE) ×1 IMPLANT
NEEDLE FILTER BLUNT 18X1 1/2 (NEEDLE) ×1 IMPLANT
PACK VIT ANT 23G (MISCELLANEOUS) IMPLANT
RING MALYGIN (MISCELLANEOUS) IMPLANT
SUT ETHILON 10-0 CS-B-6CS-B-6 (SUTURE)
SUTURE EHLN 10-0 CS-B-6CS-B-6 (SUTURE) IMPLANT
SYR 3ML LL SCALE MARK (SYRINGE) ×1 IMPLANT
WATER STERILE IRR 250ML POUR (IV SOLUTION) ×1 IMPLANT

## 2022-06-27 NOTE — Anesthesia Postprocedure Evaluation (Signed)
Anesthesia Post Note  Patient: Calvin Wallace  Procedure(s) Performed: CATARACT EXTRACTION PHACO AND INTRAOCULAR LENS PLACEMENT (IOC) RIGHT 5.87 00:38.3 (Right)  Patient location during evaluation: PACU Anesthesia Type: MAC Level of consciousness: awake and alert Pain management: pain level controlled Vital Signs Assessment: post-procedure vital signs reviewed and stable Respiratory status: spontaneous breathing, nonlabored ventilation, respiratory function stable and patient connected to nasal cannula oxygen Cardiovascular status: stable and blood pressure returned to baseline Postop Assessment: no apparent nausea or vomiting Anesthetic complications: no   No notable events documented.   Last Vitals:  Vitals:   06/27/22 1244 06/27/22 1250  BP:  125/80  Pulse: 80 64  Resp: 14 14  Temp: (!) 36.1 C (!) 36.1 C  SpO2: 94% 94%    Last Pain:  Vitals:   06/27/22 1250  TempSrc:   PainSc: 0-No pain                 Arita Miss

## 2022-06-27 NOTE — Op Note (Signed)
PREOPERATIVE DIAGNOSIS:  Nuclear sclerotic cataract of the right eye.   POSTOPERATIVE DIAGNOSIS:  Cataract   OPERATIVE PROCEDURE:ORPROCALL@   SURGEON:  Birder Robson, MD.   ANESTHESIA:  No anesthesia staff entered.  1.      Managed anesthesia care. 2.      0.44m of Shugarcaine was instilled in the eye following the paracentesis.   COMPLICATIONS:  None.   TECHNIQUE:   Stop and chop   DESCRIPTION OF PROCEDURE:  The patient was examined and consented in the preoperative holding area where the aforementioned topical anesthesia was applied to the right eye and then brought back to the Operating Room where the right eye was prepped and draped in the usual sterile ophthalmic fashion and a lid speculum was placed. A paracentesis was created with the side port blade and the anterior chamber was filled with viscoelastic. A near clear corneal incision was performed with the steel keratome. A continuous curvilinear capsulorrhexis was performed with a cystotome followed by the capsulorrhexis forceps. Hydrodissection and hydrodelineation were carried out with BSS on a blunt cannula. The lens was removed in a stop and chop  technique and the remaining cortical material was removed with the irrigation-aspiration handpiece. The capsular bag was inflated with viscoelastic and the Technis [id number]  lens was placed in the capsular bag without complication. The remaining viscoelastic was removed from the eye with the irrigation-aspiration handpiece. The wounds were hydrated. The anterior chamber was flushed with BSS and the eye was inflated to physiologic pressure. 0.163mof Vigamox was placed in the anterior chamber. The wounds were found to be water tight. The eye was dressed with Combigan. The patient was given protective glasses to wear throughout the day and a shield with which to sleep tonight. The patient was also given drops with which to begin a drop regimen today and will follow-up with me in one  day. Implant Name Type Inv. Item Serial No. Manufacturer Lot No. LRB No. Used Action  LENS IOL TECNIS EYHANCE 20.5 - [id number] Lens LENS IOL TECNIS EYHANCE 20.5 [id number]  Right 1 Implanted   Procedure(s): CATARACT EXTRACTION PHACO AND INTRAOCULAR LENS PLACEMENT (IOC) RIGHT 5.87 00:38.3 (Right)  Electronically signed: WiBirder Robson/27/2024 12:43 PM

## 2022-06-27 NOTE — Anesthesia Preprocedure Evaluation (Signed)
Anesthesia Evaluation  Patient identified by MRN, date of birth, ID band Patient awake    Reviewed: Allergy & Precautions, NPO status , Patient's Chart, lab work & pertinent test results  History of Anesthesia Complications Negative for: history of anesthetic complications  Airway Mallampati: III  TM Distance: >3 FB Neck ROM: full    Dental no notable dental hx.    Pulmonary sleep apnea and Continuous Positive Airway Pressure Ventilation , neg COPD, Patient abstained from smoking.Not current smoker   Pulmonary exam normal        Cardiovascular Exercise Tolerance: Good METShypertension, (-) CAD and (-) Past MI Normal cardiovascular exam(-) dysrhythmias      Neuro/Psych  Headaches  negative psych ROS   GI/Hepatic Neg liver ROS,GERD  Controlled,,  Endo/Other  negative endocrine ROSneg diabetes    Renal/GU negative Renal ROS     Musculoskeletal   Abdominal   Peds  Hematology negative hematology ROS (+)   Anesthesia Other Findings Past Medical History: No date: Arthritis No date: GERD (gastroesophageal reflux disease) No date: Headache     Comment:  migraines No date: History of kidney stones No date: Hypercholesteremia No date: Hyperlipidemia No date: Hypertension No date: Migraines No date: Sleep apnea     Comment:  OSA---USE C-PAP No date: Umbilical hernia  Past Surgical History: No date: BUNIONECTOMY; Left No date: CARDIAC CATHETERIZATION No date: COLONOSCOPY 07/20/2017: COLONOSCOPY WITH PROPOFOL; N/A     Comment:  Procedure: COLONOSCOPY WITH PROPOFOL;  Surgeon: Manya Silvas, MD;  Location: [facility] ENDOSCOPY;  Service:               Endoscopy;  Laterality: N/A; 07/20/2017: ESOPHAGOGASTRODUODENOSCOPY (EGD) WITH PROPOFOL; N/A     Comment:  Procedure: ESOPHAGOGASTRODUODENOSCOPY (EGD) WITH               PROPOFOL;  Surgeon: Manya Silvas, MD;  Location:               [facility] ENDOSCOPY;   Service: Endoscopy;  Laterality: N/A; No date: HERNIA REPAIR; Right     Comment:  Inguinal Hernia Repair No date: HERNIA REPAIR     Comment:  Umbilical Hernia Repair [id number]: KNEE ARTHROSCOPY; Right     Comment:  Procedure: ARTHROSCOPY KNEE MEDIAL MENISCECTOMY;                Surgeon: Hessie Knows, MD;  Location: [facility] ORS;                Service: Orthopedics;  Laterality: Right; No date: LITHOTRIPSY 1998: SHOULDER ARTHROSCOPY WITH ROTATOR CUFF REPAIR; Right No date: TONSILLECTOMY     Comment:  Age 82 No date: UMBILICAL HERNIA REPAIR  BMI    Body Mass Index: 26.78 kg/m      Reproductive/Obstetrics negative OB ROS                              Anesthesia Physical Anesthesia Plan  ASA: 2  Anesthesia Plan: MAC   Post-op Pain Management:    Induction: Intravenous  PONV Risk Score and Plan: 2 and Midazolam  Airway Management Planned: Natural Airway and Nasal Cannula  Additional Equipment:   Intra-op Plan:   Post-operative Plan:   Informed Consent: I have reviewed the patients History and Physical, chart, labs and discussed the procedure including the risks, benefits and alternatives for the proposed anesthesia with the patient or  authorized representative who has indicated his/her understanding and acceptance.     Dental Advisory Given  Plan Discussed with: Anesthesiologist, CRNA and Surgeon  Anesthesia Plan Comments: (Explained risks of anesthesia, including PONV, and rare emergencies such as cardiac events, respiratory problems, and allergic reactions, requiring invasive intervention. Discussed the role of CRNA in patient's perioperative care. Patient understands. )         Anesthesia Quick Evaluation

## 2022-06-27 NOTE — H&P (Signed)
[facility]   Primary Care Physician:  Juluis Pitch, MD Ophthalmologist: Dr. Benay Pillow  Pre-Procedure History & Physical: HPI:  Calvin Wallace is a 82 y.o. male here for cataract surgery.   Past Medical History:  Diagnosis Date   Arthritis    GERD (gastroesophageal reflux disease)    Headache    migraines   History of kidney stones    Hypercholesteremia    Hyperlipidemia    Hypertension    Migraines    Sleep apnea    OSA---USE C-PAP   Umbilical hernia     Past Surgical History:  Procedure Laterality Date   BUNIONECTOMY Left    CARDIAC CATHETERIZATION     CATARACT EXTRACTION W/PHACO Left 06/13/2022   Procedure: CATARACT EXTRACTION PHACO AND INTRAOCULAR LENS PLACEMENT (IOC) LEFT 5.53 0:39.9;  Surgeon: Birder Robson, MD;  Location: [place];  Service: Ophthalmology;  Laterality: Left;   COLONOSCOPY     COLONOSCOPY WITH PROPOFOL N/A 07/20/2017   Procedure: COLONOSCOPY WITH PROPOFOL;  Surgeon: Manya Silvas, MD;  Location: [facility] ENDOSCOPY;  Service: Endoscopy;  Laterality: N/A;   ESOPHAGOGASTRODUODENOSCOPY (EGD) WITH PROPOFOL N/A 07/20/2017   Procedure: ESOPHAGOGASTRODUODENOSCOPY (EGD) WITH PROPOFOL;  Surgeon: Manya Silvas, MD;  Location: [facility]COPY;  Service: Endoscopy;  Laterality: N/A;   HERNIA REPAIR Right    Inguinal Hernia Repair   HERNIA REPAIR     Umbilical Hernia Repair   KNEE ARTHROSCOPY Right 09/21/2016   Procedure: ARTHROSCOPY KNEE MEDIAL MENISCECTOMY;  Surgeon: Hessie Knows, MD;  Location: [facility] ORS;  Service: Orthopedics;  Laterality: Right;   LITHOTRIPSY     SHOULDER ARTHROSCOPY WITH ROTATOR CUFF REPAIR Right 1998   TONSILLECTOMY     Age 8   UMBILICAL HERNIA REPAIR      Prior to Admission medications   Medication Sig Start Date End Date Taking? Authorizing Provider  amLODipine (NORVASC) 5 MG tablet Take 5 mg by mouth daily.   Yes [provider]  desonide (DESOWEN) 0.05 % cream Apply topically as needed.   Yes  [provider]  diclofenac sodium (VOLTAREN) 1 % GEL Apply 1 application topically 2 (two) times daily as needed (pain).   Yes [provider]  doxepin (SINEQUAN) 10 MG capsule Take 10 mg by mouth at bedtime.   Yes [provider]  finasteride (PROSCAR) 5 MG tablet Take 5 mg by mouth every evening.   Yes [provider]  ketoconazole (NIZORAL) 2 % cream Apply 1 application topically once a week.   Yes [provider]  lisinopril (PRINIVIL,ZESTRIL) 40 MG tablet Take 40 mg by mouth every evening.   Yes [provider]  omeprazole (PRILOSEC) 20 MG capsule Take 20 mg by mouth daily.   Yes [provider]  pravastatin (PRAVACHOL) 40 MG tablet Take 40 mg by mouth every evening.   Yes [provider]  SUMAtriptan (IMITREX) 6 MG/0.5ML SOLN injection Inject 6 mg into the skin every 2 (two) hours as needed for migraine or headache. May repeat in 2 hours if headache persists or recurs.   Yes [provider]  tamsulosin (FLOMAX) 0.4 MG CAPS capsule Take 0.4 mg by mouth daily.   Yes [provider]    Allergies as of 04/28/2022 - Review Complete 09/13/2018  Allergen Reaction Noted   Amino acids Other (See Comments) 09/11/2016   Cheese Other (See Comments) 07/19/2017    Family History  Problem Relation Age of Onset   Breast cancer Mother    Diabetes Father  Hypertension Father    Stroke Father    Prostate cancer Father    Colon polyps Sister     Social History   Socioeconomic History   Marital status: Married    Spouse name: Not on file   Number of children: Not on file   Years of education: Not on file   Highest education level: Not on file  Occupational History   Not on file  Tobacco Use   Smoking status: Never   Smokeless tobacco: Never  Vaping Use   Vaping Use: Never used  Substance and Sexual Activity   Alcohol use: No   Drug use: No   Sexual activity: Not on file    Comment: Married    Other Topics Concern   Not on file  Social History Narrative   Not on file   Social Determinants of Health   Financial Resource Strain: Not on file  Food Insecurity: Not on file  Transportation Needs: Not on file  Physical Activity: Not on file  Stress: Not on file  Social Connections: Not on file  Intimate Partner Violence: Not on file    Review of Systems: See HPI, otherwise negative ROS  Physical Exam: BP (!) 129/58   Pulse 85   Temp 98 F (36.7 C) (Temporal)   Resp 18   Ht '5\' 11"'$  (1.803 m)   Wt 88.5 kg   SpO2 98%   BMI 27.20 kg/m  General:   Alert, cooperative in NAD Head:  Normocephalic and atraumatic. Respiratory:  Normal work of breathing. Cardiovascular:  RRR  Impression/Plan: Calvin Wallace is here for cataract surgery.  Risks, benefits, limitations, and alternatives regarding cataract surgery have been reviewed with the patient.  Questions have been answered.  All parties agreeable.   Birder Robson, MD  06/27/2022, 12:20 PM

## 2022-06-27 NOTE — Transfer of Care (Signed)
Immediate Anesthesia Transfer of Care Note  Patient: Calvin Wallace  Procedure(s) Performed: CATARACT EXTRACTION PHACO AND INTRAOCULAR LENS PLACEMENT (IOC) RIGHT 5.87 00:38.3 (Right)  Patient Location: PACU  Anesthesia Type: MAC  Level of Consciousness: awake, alert  and patient cooperative  Airway and Oxygen Therapy: Patient Spontanous Breathing and Patient connected to supplemental oxygen  Post-op Assessment: Post-op Vital signs reviewed, Patient's Cardiovascular Status Stable, Respiratory Function Stable, Patent Airway and No signs of Nausea or vomiting  Post-op Vital Signs: Reviewed and stable  Complications: No notable events documented.

## 2022-06-28 ENCOUNTER — Encounter: Payer: Self-pay | Admitting: Ophthalmology

## 2022-07-13 DIAGNOSIS — M1712 Unilateral primary osteoarthritis, left knee: Secondary | ICD-10-CM | POA: Insufficient documentation

## 2022-07-27 ENCOUNTER — Telehealth: Payer: Self-pay | Admitting: Cardiovascular Disease

## 2022-07-31 ENCOUNTER — Encounter: Payer: Self-pay | Admitting: Cardiology

## 2022-07-31 ENCOUNTER — Ambulatory Visit: Payer: Medicare PPO | Attending: Cardiology | Admitting: Cardiology

## 2022-07-31 ENCOUNTER — Ambulatory Visit (INDEPENDENT_AMBULATORY_CARE_PROVIDER_SITE_OTHER): Payer: Medicare PPO

## 2022-07-31 VITALS — BP 140/70 | HR 103 | Ht 70.5 in | Wt 195.0 lb

## 2022-07-31 DIAGNOSIS — I493 Ventricular premature depolarization: Secondary | ICD-10-CM

## 2022-07-31 DIAGNOSIS — I10 Essential (primary) hypertension: Secondary | ICD-10-CM

## 2022-07-31 DIAGNOSIS — E7800 Pure hypercholesterolemia, unspecified: Secondary | ICD-10-CM

## 2022-07-31 NOTE — Patient Instructions (Signed)
Medication Instructions:   Your physician recommends that you continue on your current medications as directed. Please refer to the Current Medication list given to you today.  *If you need a refill on your cardiac medications before your next appointment, please call your pharmacy*   Lab Work:  NONE  If you have labs (blood work) drawn today and your tests are completely normal, you will receive your results only by: [place] (if you have MyChart) OR A paper copy in the mail If you have any lab test that is abnormal or we need to change your treatment, we will call you to review the results.   Testing/Procedures:  Your physician has requested that you have an echocardiogram. Echocardiography is a painless test that uses sound waves to create images of your heart. It provides your doctor with information about the size and shape of your heart and how well your heart's chambers and valves are working. This procedure takes approximately one hour. There are no restrictions for this procedure. Please do NOT wear cologne, perfume, aftershave, or lotions (deodorant is allowed). Please arrive 15 minutes prior to your appointment time.   Your physician has recommended that you wear a Zio monitor for 14 days.  This monitor is a medical device that records the heart's electrical activity. Doctors most often use these monitors to diagnose arrhythmias. Arrhythmias are problems with the speed or rhythm of the heartbeat. The monitor is a small device applied to your chest. You can wear one while you do your normal daily activities. While wearing this monitor if you have any symptoms to push the button and record what you felt. Once you have worn this monitor for the period of time provider prescribed (Usually 14 days), you will return the monitor device in the postage paid box. Once it is returned they will download the data collected and provide [place] with a report which the provider will then  review and we will call you with those results. Important tips:  Avoid showering during the first 24 hours of wearing the monitor. Avoid excessive sweating to help maximize wear time. Do not submerge the device, no hot tubs, and no swimming pools. Keep any lotions or oils away from the patch. After 24 hours you may shower with the patch on. Take brief showers with your back facing the shower head.  Do not remove patch once it has been placed because that will interrupt data and decrease adhesive wear time. Push the button when you have any symptoms and write down what you were feeling. Once you have completed wearing your monitor, remove and place into box which has postage paid and place in your outgoing mailbox.  If for some reason you have misplaced your box then call our office and we can provide another box and/or mail it off for you.      Follow-Up: At [facility], you and your health needs are our priority.  As part of our continuing mission to provide you with exceptional heart care, we have created designated Provider Care Teams.  These Care Teams include your primary Cardiologist (physician) and Advanced Practice Providers (APPs -  Physician Assistants and Nurse Practitioners) who all work together to provide you with the care you need, when you need it.  We recommend signing up for the patient portal called "MyChart".  Sign up information is provided on this After Visit Summary.  MyChart is used to connect with patients for Virtual Visits (Telemedicine).  Patients are  able to view lab/test results, encounter notes, upcoming appointments, etc.  Non-urgent messages can be sent to your provider as well.   To learn more about what you can do with MyChart, go to [email or web address].    Your next appointment:   2 month(s)  Provider:   You may see or one of the following Advanced Practice Providers on your designated Care Team:   Murray Hodgkins, NP Christell Faith,  PA-C Cadence Kathlen Mody, PA-C Gerrie Nordmann, NP

## 2022-07-31 NOTE — Progress Notes (Signed)
Cardiology Office Note:    Date:  07/31/2022   ID:  Calvin Wallace, DOB 07-07-1940, MRN [id number]  PCP:  Calvin Pitch, MD   [place] [facility] Cardiologist:  Kate Sable, MD     Referring MD: Calvin Pitch, MD   Chief Complaint  Patient presents with   New Patient (Initial Visit)    Referred for recent abnormal EKG at PCP (Dr. Lovie Wallace at [facility]).  Patient denies cardiac symptoms.    History of Present Illness:    Calvin Wallace is a 82 y.o. male with a hx of hypertension, hyperlipidemia, GERD, OSA on CPAP who presented due to abnormal EKG.  Saw PCP approximately 2 weeks ago for a regular visit.  Noted to have frequent PVCs/trigeminal pattern on EKG.  Patient otherwise asymptomatic denying palpitations, chest pain or shortness of breath.  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  Takes aspirin 81 mg, statin 6.  He exercises frequently with water aerobics, also lower strength/conditioning exercises with weights.  Denies any symptoms.  Past Medical History:  Diagnosis Date   Arthritis    GERD (gastroesophageal reflux disease)    Headache    migraines   History of kidney stones    Hypercholesteremia    Hyperlipidemia    Hypertension    Migraines    Sleep apnea    OSA---USE C-PAP   Umbilical hernia     Past Surgical History:  Procedure Laterality Date   BUNIONECTOMY Left    CARDIAC CATHETERIZATION     CATARACT EXTRACTION W/PHACO Left 06/13/2022   Procedure: CATARACT EXTRACTION PHACO AND INTRAOCULAR LENS PLACEMENT (IOC) LEFT 5.53 0:39.9;  Surgeon: Birder Robson, MD;  Location: [facility];  Service: Ophthalmology;  Laterality: Left;   CATARACT EXTRACTION W/PHACO Right 06/27/2022   Procedure: CATARACT EXTRACTION PHACO AND INTRAOCULAR LENS PLACEMENT (IOC) RIGHT 5.87 00:38.3;  Surgeon: Birder Robson, MD;  Location: [facility];  Service: Ophthalmology;  Laterality: Right;   COLONOSCOPY     COLONOSCOPY WITH PROPOFOL N/A 07/20/2017   Procedure: COLONOSCOPY WITH PROPOFOL;  Surgeon: Manya Silvas, MD;  Location: [facility] ENDOSCOPY;  Service: Endoscopy;  Laterality: N/A;   ESOPHAGOGASTRODUODENOSCOPY (EGD) WITH PROPOFOL N/A 07/20/2017   Procedure: ESOPHAGOGASTRODUODENOSCOPY (EGD) WITH PROPOFOL;  Surgeon: Manya Silvas, MD;  Location: [facility] ENDOSCOPY;  Service: Endoscopy;  Laterality: N/A;   HERNIA REPAIR Right    Inguinal Hernia Repair   HERNIA REPAIR     Umbilical Hernia Repair   KNEE ARTHROSCOPY Right 09/21/2016   Procedure: ARTHROSCOPY KNEE MEDIAL MENISCECTOMY;  Surgeon: Hessie Knows, MD;  Location: [facility]S;  Service: Orthopedics;  Laterality: Right;   LITHOTRIPSY     SHOULDER ARTHROSCOPY WITH ROTATOR CUFF REPAIR Right 1998   TONSILLECTOMY     Age 76   UMBILICAL HERNIA REPAIR      Current Medications: Current Meds  Medication Sig   amLODipine (NORVASC) 5 MG tablet Take 5 mg by mouth daily.   aspirin EC 81 MG tablet Take 81 mg by mouth daily. Swallow whole.   desonide (DESOWEN) 0.05 % cream Apply topically as needed.   diclofenac sodium (VOLTAREN) 1 % GEL Apply 1 application topically 2 (two) times daily as needed (pain).   doxepin (SINEQUAN) 10 MG capsule Take 10 mg by mouth at bedtime.   finasteride (PROSCAR) 5 MG tablet Take 5 mg by mouth every evening.   ketoconazole (NIZORAL)  2 % cream Apply 1 application topically once a week.   lisinopril (PRINIVIL,ZESTRIL) 40 MG tablet Take 40 mg by mouth every evening.   meloxicam (MOBIC) 7.5 MG tablet Take 1 tablet by mouth 2 (two) times daily with a meal.   pravastatin (PRAVACHOL) 40 MG tablet Take 40 mg by mouth every evening.   SUMAtriptan (IMITREX) 6 MG/0.5ML SOLN injection Inject 6 mg into the skin every 2 (two) hours as needed for migraine or headache. May repeat in 2 hours if headache persists or recurs.    tamsulosin (FLOMAX) 0.4 MG CAPS capsule Take 0.4 mg by mouth daily.     Allergies:   Amino acids and Cheese   Social History   Socioeconomic History   Marital status: Married    Spouse name: Not on file   Number of children: Not on file   Years of education: Not on file   Highest education level: Not on file  Occupational History   Not on file  Tobacco Use   Smoking status: Never   Smokeless tobacco: Never  Vaping Use   Vaping Use: Never used  Substance and Sexual Activity   Alcohol use: No   Drug use: No   Sexual activity: Not on file    Comment: Married   Other Topics Concern   Not on file  Social History Narrative   Not on file   Social Determinants of Health   Financial Resource Strain: Not on file  Food Insecurity: Not on file  Transportation Needs: Not on file  Physical Activity: Not on file  Stress: Not on file  Social Connections: Not on file     Family History: The patient's family history includes Breast cancer in his mother; Colon polyps in his sister; Diabetes in his father; Hypertension in his father; Prostate cancer in his father; Stroke in his father.  ROS:   Please see the history of present illness.     All other systems reviewed and are negative.  EKGs/Labs/Other Studies Reviewed:    The following studies were reviewed today:   EKG:  EKG is  ordered today.  The ekg ordered today demonstrates sinus tachycardia, frequent PVCs, heart rate 103.  Recent Labs: No results found for requested labs within last 365 days.  Recent Lipid Panel No results found for: "CHOL", "TRIG", "HDL", "CHOLHDL", "VLDL", "LDLCALC", "LDLDIRECT"  Outside lipid panel 01/19/2019 total cholesterol 153, triglycerides 86, LDL 92, HDL 43.6  Risk Assessment/Calculations:     HYPERTENSION CONTROL Vitals:   07/31/22 1345 07/31/22 1349  BP: 130/78 (!) 140/70    The patient's blood pressure is elevated above target today.  In order to address the patient's elevated  BP: Blood pressure will be monitored at home to determine if medication changes need to be made.            Physical Exam:    VS:  BP (!) 140/70 (BP Location: Right Arm, Patient Position: Sitting, Cuff Size: Normal)   Pulse (!) 103   Ht 5' 10.5" (1.791 m)   Wt 195 lb (88.5 kg)   SpO2 97%   BMI 27.58 kg/m     Wt Readings from Last 3 Encounters:  07/31/22 195 lb (88.5 kg)  06/27/22 195 lb (88.5 kg)  06/13/22 194 lb (88 kg)     GEN:  Well nourished, well developed in no acute distress HEENT: Normal NECK: No JVD; No carotid bruits CARDIAC: RRR, no murmurs, rubs, gallops RESPIRATORY:  Clear to auscultation without rales, wheezing  or rhonchi  ABDOMEN: Soft, non-tender, non-distended MUSCULOSKELETAL:  No edema; No deformity  SKIN: Warm and dry NEUROLOGIC:  Alert and oriented x 3 PSYCHIATRIC:  Normal affect   ASSESSMENT:    1. PVC (premature ventricular contraction)   2. Primary hypertension   3. Pure hypercholesterolemia    PLAN:    In order of problems listed above:  Frequent PVCs, place cardiac monitor to quantify PVC burden.  Obtain echo to evaluate cardiac function. Hypertension, continue lisinopril 40 mg, Norvasc 5 mg. Hyperlipidemia, continue Pravachol 40 mg daily  Follow-up after echo and cardiac monitor.      Medication Adjustments/Labs and Tests Ordered: Current medicines are reviewed at length with the patient today.  Concerns regarding medicines are outlined above.  Orders Placed This Encounter  Procedures   LONG TERM MONITOR (3-14 DAYS)   EKG 12-Lead   ECHOCARDIOGRAM COMPLETE   No orders of the defined types were placed in this encounter.   Patient Instructions  Medication Instructions:   Your physician recommends that you continue on your current medications as directed. Please refer to the Current Medication list given to you today.  *If you need a refill on your cardiac medications before your next appointment, please call your  pharmacy*   Lab Work:  NONE  If you have labs (blood work) drawn today and your tests are completely normal, you will receive your results only by: [place] (if you have MyChart) OR A paper copy in the mail If you have any lab test that is abnormal or we need to change your treatment, we will call you to review the results.   Testing/Procedures:  Your physician has requested that you have an echocardiogram. Echocardiography is a painless test that uses sound waves to create images of your heart. It provides your doctor with information about the size and shape of your heart and how well your heart's chambers and valves are working. This procedure takes approximately one hour. There are no restrictions for this procedure. Please do NOT wear cologne, perfume, aftershave, or lotions (deodorant is allowed). Please arrive 15 minutes prior to your appointment time.   Your physician has recommended that you wear a Zio monitor for 14 days.  This monitor is a medical device that records the heart's electrical activity. Doctors most often use these monitors to diagnose arrhythmias. Arrhythmias are problems with the speed or rhythm of the heartbeat. The monitor is a small device applied to your chest. You can wear one while you do your normal daily activities. While wearing this monitor if you have any symptoms to push the button and record what you felt. Once you have worn this monitor for the period of time provider prescribed (Usually 14 days), you will return the monitor device in the postage paid box. Once it is returned they will download the data collected and provide [place] with a report which the provider will then review and we will call you with those results. Important tips:  Avoid showering during the first 24 hours of wearing the monitor. Avoid excessive sweating to help maximize wear time. Do not submerge the device, no hot tubs, and no swimming pools. Keep any lotions or oils away  from the patch. After 24 hours you may shower with the patch on. Take brief showers with your back facing the shower head.  Do not remove patch once it has been placed because that will interrupt data and decrease adhesive wear time. Push the button when you have any  symptoms and write down what you were feeling. Once you have completed wearing your monitor, remove and place into box which has postage paid and place in your outgoing mailbox.  If for some reason you have misplaced your box then call our office and we can provide another box and/or mail it off for you.      Follow-Up: At [facility], you and your health needs are our priority.  As part of our continuing mission to provide you with exceptional heart care, we have created designated Provider Care Teams.  These Care Teams include your primary Cardiologist (physician) and Advanced Practice Providers (APPs -  Physician Assistants and Nurse Practitioners) who all work together to provide you with the care you need, when you need it.  We recommend signing up for the patient portal called "MyChart".  Sign up information is provided on this After Visit Summary.  MyChart is used to connect with patients for Virtual Visits (Telemedicine).  Patients are able to view lab/test results, encounter notes, upcoming appointments, etc.  Non-urgent messages can be sent to your provider as well.   To learn more about what you can do with MyChart, go to [email or web address].    Your next appointment:   2 month(s)  Provider:   You may see or one of the following Advanced Practice Providers on your designated Care Team:   Murray Hodgkins, NP Christell Faith, PA-C Cadence Kathlen Mody, PA-C Gerrie Nordmann, NP    Signed, Kate Sable, MD  07/31/2022 3:12 PM    [place]

## 2022-08-04 DIAGNOSIS — I493 Ventricular premature depolarization: Secondary | ICD-10-CM

## 2022-08-04 NOTE — Telephone Encounter (Signed)
Error

## 2022-08-31 ENCOUNTER — Ambulatory Visit: Payer: Medicare PPO | Attending: Cardiology

## 2022-08-31 DIAGNOSIS — I340 Nonrheumatic mitral (valve) insufficiency: Secondary | ICD-10-CM

## 2022-08-31 DIAGNOSIS — I493 Ventricular premature depolarization: Secondary | ICD-10-CM | POA: Diagnosis not present

## 2022-08-31 DIAGNOSIS — I351 Nonrheumatic aortic (valve) insufficiency: Secondary | ICD-10-CM

## 2022-08-31 LAB — ECHOCARDIOGRAM COMPLETE
AR max vel: 3.26 cm2
AV Area VTI: 3.46 cm2
AV Area mean vel: 3.32 cm2
AV Mean grad: 4 mmHg
AV Peak grad: 7.8 mmHg
Ao pk vel: 1.4 m/s
Area-P 1/2: 3.21 cm2
P 1/2 time: 558 msec
S' Lateral: 3.1 cm

## 2022-10-03 ENCOUNTER — Encounter: Payer: Self-pay | Admitting: Cardiology

## 2022-10-03 ENCOUNTER — Ambulatory Visit: Payer: Medicare PPO | Attending: Cardiology | Admitting: Cardiology

## 2022-10-03 VITALS — BP 138/62 | HR 53 | Ht 70.5 in | Wt 194.0 lb

## 2022-10-03 DIAGNOSIS — I10 Essential (primary) hypertension: Secondary | ICD-10-CM | POA: Diagnosis not present

## 2022-10-03 DIAGNOSIS — E7800 Pure hypercholesterolemia, unspecified: Secondary | ICD-10-CM

## 2022-10-03 DIAGNOSIS — I493 Ventricular premature depolarization: Secondary | ICD-10-CM

## 2022-10-03 NOTE — Progress Notes (Signed)
Cardiology Office Note:    Date:  10/03/2022   ID:  Calvin Wallace, DOB [birth year], MRN [id number]  PCP:  Dorothey Baseman, MD   [facility] Cardiologist:  Debbe Odea, MD     Referring MD: Dorothey Baseman, MD   Chief Complaint  Patient presents with   Follow-up    Discuss test results.    History of Present Illness:    Calvin Wallace is a 82 y.o. male with a hx of hypertension, hyperlipidemia, frequent PVCs, GERD, OSA on CPAP who presents for follow-up.  Patient being seen due to frequent PVCs, cardiac monitor and echocardiogram was ordered to evaluate any significant structural abnormalities.  He denies chest pain or shortness of breath.  Exercises frequently with water aerobics, also lifts weights without any symptoms.  Feels well, no new concerns at this time, presents for testing results.   Past Medical History:  Diagnosis Date   Arthritis    GERD (gastroesophageal reflux disease)    Headache    migraines   History of kidney stones    Hypercholesteremia    Hyperlipidemia    Hypertension    Migraines    Sleep apnea    OSA---USE C-PAP   Umbilical hernia     Past Surgical History:  Procedure Laterality Date   BUNIONECTOMY Left    CARDIAC CATHETERIZATION     CATARACT EXTRACTION W/PHACO Left 06/13/2022   Procedure: CATARACT EXTRACTION PHACO AND INTRAOCULAR LENS PLACEMENT (IOC) LEFT 5.53 0:39.9;  Surgeon: Galen Manila, MD;  Location: [facility];  Service: Ophthalmology;  Laterality: Left;   CATARACT EXTRACTION W/PHACO Right 06/27/2022   Procedure: CATARACT EXTRACTION PHACO AND INTRAOCULAR LENS PLACEMENT (IOC) RIGHT 5.87 00:38.3;  Surgeon: Galen Manila, MD;  Location: [facility];  Service: Ophthalmology;  Laterality: Right;   COLONOSCOPY     COLONOSCOPY WITH PROPOFOL N/A 07/20/2017   Procedure: COLONOSCOPY WITH PROPOFOL;  Surgeon: Scot Jun, MD;  Location: [facility] ENDOSCOPY;  Service: Endoscopy;  Laterality: N/A;    ESOPHAGOGASTRODUODENOSCOPY (EGD) WITH PROPOFOL N/A 07/20/2017   Procedure: ESOPHAGOGASTRODUODENOSCOPY (EGD) WITH PROPOFOL;  Surgeon: Scot Jun, MD;  Location: [facility] ENDOSCOPY;  Service: Endoscopy;  Laterality: N/A;   HERNIA REPAIR Right    Inguinal Hernia Repair   HERNIA REPAIR     Umbilical Hernia Repair   KNEE ARTHROSCOPY Right 09/21/2016   Procedure: ARTHROSCOPY KNEE MEDIAL MENISCECTOMY;  Surgeon: Kennedy Bucker, MD;  Location: [facility];  Service: Orthopedics;  Laterality: Right;   LITHOTRIPSY     SHOULDER ARTHROSCOPY WITH ROTATOR CUFF REPAIR Right 1998   TONSILLECTOMY     Age 40   UMBILICAL HERNIA REPAIR      Current Medications: Current Meds  Medication Sig   amLODipine (NORVASC) 5 MG tablet Take 5 mg by mouth daily.   aspirin EC 81 MG tablet Take 81 mg by mouth daily. Swallow whole.   desonide (DESOWEN) 0.05 % cream Apply topically as needed.   diclofenac sodium (VOLTAREN) 1 % GEL Apply 1 application topically 2 (two) times daily as needed (pain).   doxepin (SINEQUAN) 10 MG capsule Take 10 mg by mouth at bedtime.   finasteride (PROSCAR) 5 MG tablet Take 5 mg by mouth every evening.   ketoconazole (NIZORAL) 2 % cream Apply 1 application topically once a week.   lisinopril (PRINIVIL,ZESTRIL) 40 MG tablet Take 40 mg by mouth every evening.   pravastatin (PRAVACHOL) 40 MG tablet Take 40 mg by mouth every evening.   SUMAtriptan (IMITREX) 6 MG/0.5ML SOLN injection  Inject 6 mg into the skin every 2 (two) hours as needed for migraine or headache. May repeat in 2 hours if headache persists or recurs.   tamsulosin (FLOMAX) 0.4 MG CAPS capsule Take 0.4 mg by mouth daily.     Allergies:   Chocolate flavor, Mushroom extract complex, Amino acids, and Cheese   Social History   Socioeconomic History   Marital status: Married    Spouse name: Not on file   Number of children: Not on file   Years of education: Not on file   Highest education level: Not on file  Occupational History    Not on file  Tobacco Use   Smoking status: Never   Smokeless tobacco: Never  Vaping Use   Vaping Use: Never used  Substance and Sexual Activity   Alcohol use: No   Drug use: No   Sexual activity: Not on file    Comment: Married   Other Topics Concern   Not on file  Social History Narrative   Not on file   Social Determinants of Health   Financial Resource Strain: Not on file  Food Insecurity: Not on file  Transportation Needs: Not on file  Physical Activity: Not on file  Stress: Not on file  Social Connections: Not on file     Family History: The patient's family history includes Breast cancer in his mother; Colon polyps in his sister; Diabetes in his father; Hypertension in his father; Prostate cancer in his father; Stroke in his father.  ROS:   Please see the history of present illness.     All other systems reviewed and are negative.  EKGs/Labs/Other Studies Reviewed:    The following studies were reviewed today:   EKG:  EKG is  ordered today.  The ekg ordered today demonstrates sinus tachycardia, frequent PVCs, heart rate 103.  Recent Labs: No results found for requested labs within last 365 days.  Recent Lipid Panel No results found for: "CHOL", "TRIG", "HDL", "CHOLHDL", "VLDL", "LDLCALC", "LDLDIRECT"  Outside lipid panel 01/19/2019 total cholesterol 153, triglycerides 86, LDL 92, HDL 43.6  Risk Assessment/Calculations:               Physical Exam:    VS:  BP 138/62 (BP Location: Left Arm, Patient Position: Sitting, Cuff Size: Normal)   Pulse (!) 53   Ht 5' 10.5" (1.791 m)   Wt 194 lb (88 kg)   SpO2 97%   BMI 27.44 kg/m     Wt Readings from Last 3 Encounters:  10/03/22 194 lb (88 kg)  07/31/22 195 lb (88.5 kg)  06/27/22 195 lb (88.5 kg)     GEN:  Well nourished, well developed in no acute distress HEENT: Normal NECK: No JVD; No carotid bruits CARDIAC: RRR, no murmurs, rubs, gallops RESPIRATORY:  Clear to auscultation without rales,  wheezing or rhonchi  ABDOMEN: Soft, non-tender, non-distended MUSCULOSKELETAL:  No edema; No deformity  SKIN: Warm and dry NEUROLOGIC:  Alert and oriented x 3 PSYCHIATRIC:  Normal affect   ASSESSMENT:    1. PVC (premature ventricular contraction)   2. Primary hypertension   3. Pure hypercholesterolemia    PLAN:    In order of problems listed above:  Frequent PVCs, 19% burden on monitor.  Echo with normal EF 60 to 65%.  Patient otherwise asymptomatic.  Heart rate 53.  Although could be due to PVCs.  Refer to EP for any additional input. Hypertension, BP controlled, continue lisinopril 40 mg, Norvasc 5 mg. Hyperlipidemia, continue  Pravachol 40 mg daily  Follow-up in 6 months.      Medication Adjustments/Labs and Tests Ordered: Current medicines are reviewed at length with the patient today.  Concerns regarding medicines are outlined above.  Orders Placed This Encounter  Procedures   Ambulatory referral to Cardiac Electrophysiology   No orders of the defined types were placed in this encounter.   Patient Instructions  Medication Instructions:   Your physician recommends that you continue on your current medications as directed. Please refer to the Current Medication list given to you today.  *If you need a refill on your cardiac medications before your next appointment, please call your pharmacy*   Lab Work:  None Ordered  If you have labs (blood work) drawn today and your tests are completely normal, you will receive your results only by: MyChart Message (if you have MyChart) OR A paper copy in the mail If you have any lab test that is abnormal or we need to change your treatment, we will call you to review the results.   Testing/Procedures:  None Ordered   Follow-Up: At [facility], you and your health needs are our priority.  As part of our continuing mission to provide you with exceptional heart care, we have created designated Provider Care Teams.   These Care Teams include your primary Cardiologist (physician) and Advanced Practice Providers (APPs -  Physician Assistants and Nurse Practitioners) who all work together to provide you with the care you need, when you need it.  We recommend signing up for the patient portal called "MyChart".  Sign up information is provided on this After Visit Summary.  MyChart is used to connect with patients for Virtual Visits (Telemedicine).  Patients are able to view lab/test results, encounter notes, upcoming appointments, etc.  Non-urgent messages can be sent to your provider as well.   To learn more about what you can do with MyChart, go to [email or web address].    Your next appointment:   6 month(s)  Provider:   You may see Debbe Odea, MD or one of the following Advanced Practice Providers on your designated Care Team:   Nicolasa Ducking, NP Eula Listen, PA-C Cadence Fransico Michael, PA-C Charlsie Quest, NP   Signed, Debbe Odea, MD  10/03/2022 10:41 AM     [facility]

## 2022-10-03 NOTE — Patient Instructions (Signed)
Medication Instructions:   Your physician recommends that you continue on your current medications as directed. Please refer to the Current Medication list given to you today.  *If you need a refill on your cardiac medications before your next appointment, please call your pharmacy*   Lab Work:  None Ordered  If you have labs (blood work) drawn today and your tests are completely normal, you will receive your results only by: MyChart Message (if you have MyChart) OR A paper copy in the mail If you have any lab test that is abnormal or we need to change your treatment, we will call you to review the results.   Testing/Procedures:  None Ordered   Follow-Up: At  [facility], you and your health needs are our priority.  As part of our continuing mission to provide you with exceptional heart care, we have created designated Provider Care Teams.  These Care Teams include your primary Cardiologist (physician) and Advanced Practice Providers (APPs -  Physician Assistants and Nurse Practitioners) who all work together to provide you with the care you need, when you need it.  We recommend signing up for the patient portal called "MyChart".  Sign up information is provided on this After Visit Summary.  MyChart is used to connect with patients for Virtual Visits (Telemedicine).  Patients are able to view lab/test results, encounter notes, upcoming appointments, etc.  Non-urgent messages can be sent to your provider as well.   To learn more about what you can do with MyChart, go to [email or web address].    Your next appointment:   6 month(s)  Provider:   You may see Brian Agbor-Etang, MD or one of the following Advanced Practice Providers on your designated Care Team:   Christopher Berge, NP Ryan Dunn, PA-C Cadence Furth, PA-C Sheri Hammock, NP 

## 2022-11-15 ENCOUNTER — Institutional Professional Consult (permissible substitution): Payer: Medicare PPO | Admitting: Cardiology

## 2022-11-23 ENCOUNTER — Encounter: Payer: Self-pay | Admitting: Cardiology

## 2022-12-04 ENCOUNTER — Other Ambulatory Visit: Payer: Self-pay

## 2022-12-04 ENCOUNTER — Emergency Department
Admission: EM | Admit: 2022-12-04 | Discharge: 2022-12-04 | Disposition: A | Discharge status: Home / Self Care | Payer: Medicare PPO | Attending: Emergency Medicine | Admitting: Emergency Medicine

## 2022-12-04 ENCOUNTER — Encounter: Payer: Self-pay | Admitting: Physician Assistant

## 2022-12-04 ENCOUNTER — Ambulatory Visit
Admission: EM | Admit: 2022-12-04 | Discharge: 2022-12-04 | Disposition: A | Discharge status: Home / Self Care | Payer: Medicare PPO | Attending: Physician Assistant | Admitting: Physician Assistant

## 2022-12-04 ENCOUNTER — Emergency Department: Payer: Medicare PPO

## 2022-12-04 DIAGNOSIS — R42 Dizziness and giddiness: Secondary | ICD-10-CM | POA: Insufficient documentation

## 2022-12-04 LAB — CBC
HCT: 47.7 % (ref 39.0–52.0)
Hemoglobin: 16.4 g/dL (ref 13.0–17.0)
MCH: 30.5 pg (ref 26.0–34.0)
MCHC: 34.4 g/dL (ref 30.0–36.0)
MCV: 88.7 fL (ref 80.0–100.0)
Platelets: 167 10*3/uL (ref 150–400)
RBC: 5.38 MIL/uL (ref 4.22–5.81)
RDW: 12.5 % (ref 11.5–15.5)
WBC: 10.3 10*3/uL (ref 4.0–10.5)
nRBC: 0 % (ref 0.0–0.2)

## 2022-12-04 LAB — POCT FASTING CBG KUC MANUAL ENTRY: POCT Glucose (KUC): 134 mg/dL — AB (ref 70–99)

## 2022-12-04 LAB — POCT URINALYSIS DIP (MANUAL ENTRY)
Bilirubin, UA: NEGATIVE
Glucose, UA: NEGATIVE mg/dL
Ketones, POC UA: NEGATIVE mg/dL
Leukocytes, UA: NEGATIVE
Nitrite, UA: NEGATIVE
Protein Ur, POC: 30 mg/dL — AB
Spec Grav, UA: >=1.03 — AB (ref 1.010–1.025)
Urobilinogen, UA: 0.2 E.U./dL
pH, UA: 6 (ref 5.0–8.0)

## 2022-12-04 LAB — BASIC METABOLIC PANEL
Anion gap: 8 (ref 5–15)
BUN: 22 mg/dL (ref 8–23)
CO2: 24 mmol/L (ref 22–32)
Calcium: 9.2 mg/dL (ref 8.9–10.3)
Chloride: 107 mmol/L (ref 98–111)
Creatinine, Ser: 1.04 mg/dL (ref 0.61–1.24)
GFR, Estimated: >60 mL/min (ref >60)
Glucose, Bld: 128 mg/dL — ABNORMAL HIGH (ref 70–99)
Potassium: 4.1 mmol/L (ref 3.5–5.1)
Sodium: 139 mmol/L (ref 135–145)

## 2022-12-04 MED ORDER — ONDANSETRON 4 MG PO TBDP: 4.0000 mg | ORAL_TABLET | Freq: Three times a day (TID) | ORAL | 0 refills | Status: AC | PRN

## 2022-12-04 MED ORDER — MECLIZINE HCL 25 MG PO TABS: 25.0000 mg | ORAL_TABLET | Freq: Three times a day (TID) | ORAL | 0 refills | Status: AC | PRN

## 2022-12-04 MED ORDER — SODIUM CHLORIDE 0.9 % IV BOLUS
500.0000 mL | Freq: Once | INTRAVENOUS | Status: AC
  Administered 2022-12-04: 500 mL via INTRAVENOUS

## 2022-12-04 MED ORDER — MECLIZINE HCL 25 MG PO TABS
25.0000 mg | ORAL_TABLET | Freq: Once | ORAL | Status: AC
  Administered 2022-12-04: 25 mg via ORAL
  Filled 2022-12-04: qty 1

## 2022-12-04 MED ORDER — ONDANSETRON HCL 4 MG/2ML IJ SOLN
4.0000 mg | Freq: Once | INTRAMUSCULAR | Status: AC
  Administered 2022-12-04: 4 mg via INTRAVENOUS
  Filled 2022-12-04: qty 2

## 2022-12-04 NOTE — ED Notes (Signed)
Pt back from MRI, wife at bedside. No needs expressed at this time.

## 2022-12-04 NOTE — ED Provider Notes (Signed)
[facility] Provider Note    Event Date/Time   First MD Initiated Contact with Patient 12/04/22 1412     (approximate)   History   Dizziness and Emesis   HPI  Calvin Wallace is a 82 y.o. male who presents to the emergency department today because of concerns for dizziness.  He first noticed that yesterday in the early hours in the morning when he was woken up from sleep.  Did seem to get somewhat better yesterday however last night he again had it come on strongly.  He has a sensation of dizziness and imbalance.  He denies any significant headache.  He has had nausea and vomiting.  The patient denies similar symptoms in the past.  No recent illness. No new medications. No weakness in arms or legs. No vision change.      Physical Exam   Triage Vital Signs: ED Triage Vitals  Encounter Vitals Group     BP 12/04/22 1112 119/69     Systolic BP Percentile --      Diastolic BP Percentile --      Pulse Rate 12/04/22 1112 87     Resp 12/04/22 1112 18     Temp 12/04/22 1112 98.3 F (36.8 C)     Temp Source 12/04/22 1112 Oral     SpO2 12/04/22 1112 95 %     Weight 12/04/22 1112 194 lb (88 kg)     Height 12/04/22 1112 5\' 10"  (1.778 m)     Head Circumference --      Peak Flow --      Pain Score 12/04/22 1117 0     Pain Loc --      Pain Education --      Exclude from Growth Chart --     Most recent vital signs: Vitals:   12/04/22 1112  BP: 119/69  Pulse: 87  Resp: 18  Temp: 98.3 F (36.8 C)  SpO2: 95%   General: Awake, alert, oriented. CV:  Good peripheral perfusion. Regular rate and rhythm. Resp:  Normal effort. Lungs clear. Abd:  No distention.  Other:  PERRL. EOMI. No nystagmus on exam.    ED Results / Procedures / Treatments   Labs (all labs ordered are listed, but only abnormal results are displayed) Labs Reviewed  BASIC METABOLIC PANEL - Abnormal; Notable for the following components:      Result Value   Glucose, Bld 128 (*)    All  other components within normal limits  CBC     EKG  I, Phineas Semen, attending physician, personally viewed and interpreted this EKG  EKG Time: 1120 Rate: 93 Rhythm: normal sinus rhythm Axis: left axis deviation Intervals: qtc 494 QRS: RBBB ST changes: no st elevation Impression: abnormal ekg    RADIOLOGY I independently interpreted and visualized the CT head. My interpretation: No bleed Radiology interpretation:  IMPRESSION:  No acute intracranial abnormality.     PROCEDURES:  Critical Care performed: No   MEDICATIONS ORDERED IN ED: Medications  sodium chloride 0.9 % bolus 500 mL (has no administration in time range)  ondansetron (ZOFRAN) injection 4 mg (has no administration in time range)  meclizine (ANTIVERT) tablet 25 mg (has no administration in time range)     IMPRESSION / MDM / ASSESSMENT AND PLAN / ED COURSE  I reviewed the triage vital signs and the nursing notes.  Differential diagnosis includes, but is not limited to, inner ear pathology, CVA  Patient's presentation is most consistent with acute presentation with potential threat to life or bodily function.   The patient is on the cardiac monitor to evaluate for evidence of arrhythmia and/or significant heart rate changes.  Patient presented to the emergency department today because of concerns for dizziness that started yesterday morning.  No focal neurodeficits on exam.  CT head without concerning findings.  This time I do think vertigo likely however patient does not have history of that so we will check MRI to evaluate for possible CVA.  Patient states his father did have a stroke.      FINAL CLINICAL IMPRESSION(S) / ED DIAGNOSES   Final diagnoses:  Dizziness     Note:  This document was prepared using Dragon voice recognition software and may include unintentional dictation errors.    Phineas Semen, MD 12/04/22 [phone]

## 2022-12-04 NOTE — ED Notes (Signed)
Urinalysis completed at [facility].

## 2022-12-04 NOTE — ED Provider Notes (Signed)
Renaldo Fiddler    CSN: [id number] Arrival date & time: 12/04/22  [id number]      History   Chief Complaint Chief Complaint  Patient presents with   Dizziness    HPI Calvin Wallace is a 82 y.o. male.   Patient here today for evaluation of dizziness, nausea and vomiting that started yesterday.  He denies any chest pain.  He states that bending over, turning his head and certain movements make dizziness worse.  He does not have any history of vertigo.  He does not report fever.  He is not short of breath.  He does not report treatment for symptoms.  The history is provided by the patient and the spouse.  Dizziness Associated symptoms: nausea and vomiting     Past Medical History:  Diagnosis Date   Arthritis    GERD (gastroesophageal reflux disease)    Headache    migraines   History of kidney stones    Hypercholesteremia    Hyperlipidemia    Hypertension    Migraines    Sleep apnea    OSA---USE C-PAP   Umbilical hernia     There are no problems to display for this patient.   Past Surgical History:  Procedure Laterality Date   BUNIONECTOMY Left    CARDIAC CATHETERIZATION     CATARACT EXTRACTION W/PHACO Left 06/13/2022   Procedure: CATARACT EXTRACTION PHACO AND INTRAOCULAR LENS PLACEMENT (IOC) LEFT 5.53 0:39.9;  Surgeon: Galen Manila, MD;  Location: [facility];  Service: Ophthalmology;  Laterality: Left;   CATARACT EXTRACTION W/PHACO Right 06/27/2022   Procedure: CATARACT EXTRACTION PHACO AND INTRAOCULAR LENS PLACEMENT (IOC) RIGHT 5.87 00:38.3;  Surgeon: Galen Manila, MD;  Location: [facility];  Service: Ophthalmology;  Laterality: Right;   COLONOSCOPY     COLONOSCOPY WITH PROPOFOL N/A 07/20/2017   Procedure: COLONOSCOPY WITH PROPOFOL;  Surgeon: Scot Jun, MD;  Location: [facility];  Service: Endoscopy;  Laterality: N/A;   ESOPHAGOGASTRODUODENOSCOPY (EGD) WITH PROPOFOL N/A 07/20/2017   Procedure: ESOPHAGOGASTRODUODENOSCOPY (EGD)  WITH PROPOFOL;  Surgeon: Scot Jun, MD;  Location: [facility];  Service: Endoscopy;  Laterality: N/A;   HERNIA REPAIR Right    Inguinal Hernia Repair   HERNIA REPAIR     Umbilical Hernia Repair   KNEE ARTHROSCOPY Right 09/21/2016   Procedure: ARTHROSCOPY KNEE MEDIAL MENISCECTOMY;  Surgeon: Kennedy Bucker, MD;  Location: [facility];  Service: Orthopedics;  Laterality: Right;   LITHOTRIPSY     SHOULDER ARTHROSCOPY WITH ROTATOR CUFF REPAIR Right 1998   TONSILLECTOMY     Age 59   UMBILICAL HERNIA REPAIR         Home Medications    Prior to Admission medications   Medication Sig Start Date End Date Taking? Authorizing Provider  amLODipine (NORVASC) 5 MG tablet Take 5 mg by mouth daily.    [provider]  aspirin EC 81 MG tablet Take 81 mg by mouth daily. Swallow whole.    [provider]  desonide (DESOWEN) 0.05 % cream Apply topically as needed.    [provider]  diclofenac sodium (VOLTAREN) 1 % GEL Apply 1 application topically 2 (two) times daily as needed (pain).    [provider]  doxepin (SINEQUAN) 10 MG capsule Take 10 mg by mouth at bedtime.    [provider]  finasteride (PROSCAR) 5 MG tablet Take 5 mg by mouth every evening.    [provider]  ketoconazole (NIZORAL) 2 % cream Apply 1 application topically once  a week.    [provider]  lisinopril (PRINIVIL,ZESTRIL) 40 MG tablet Take 40 mg by mouth every evening.    [provider]  meloxicam (MOBIC) 7.5 MG tablet Take 1 tablet by mouth 2 (two) times daily with a meal. 07/13/22   [provider]  omeprazole (PRILOSEC) 20 MG capsule Take 20 mg by mouth daily. Patient not taking: Reported on 07/31/2022    [provider]  pravastatin (PRAVACHOL) 40 MG tablet Take 40 mg by mouth every evening.    [provider]  SUMAtriptan (IMITREX) 6 MG/0.5ML SOLN injection Inject 6 mg into the skin every 2 (two) hours as needed for  migraine or headache. May repeat in 2 hours if headache persists or recurs.    [provider]  tamsulosin (FLOMAX) 0.4 MG CAPS capsule Take 0.4 mg by mouth daily.    [provider]    Family History Family History  Problem Relation Age of Onset   Breast cancer Mother    Diabetes Father    Hypertension Father    Stroke Father    Prostate cancer Father    Colon polyps Sister     Social History Social History   Tobacco Use   Smoking status: Never   Smokeless tobacco: Never  Vaping Use   Vaping status: Never Used  Substance Use Topics   Alcohol use: No   Drug use: No     Allergies   Chocolate flavor, Mushroom extract complex, Amino acids, and Cheese   Review of Systems Review of Systems  Constitutional:  Negative for chills and fever.  Eyes:  Negative for discharge and redness.  Gastrointestinal:  Positive for nausea and vomiting.  Neurological:  Positive for dizziness. Negative for facial asymmetry and numbness.     Physical Exam Triage Vital Signs ED Triage Vitals  Encounter Vitals Group     BP 12/04/22 1037 132/71     Systolic BP Percentile --      Diastolic BP Percentile --      Pulse Rate 12/04/22 1037 82     Resp 12/04/22 1037 18     Temp 12/04/22 1037 97.6 F (36.4 C)     Temp src --      SpO2 12/04/22 1037 98 %     Weight --      Height --      Head Circumference --      Peak Flow --      Pain Score 12/04/22 1048 0     Pain Loc --      Pain Education --      Exclude from Growth Chart --    No data found.  Updated Vital Signs BP 132/71   Pulse 82   Temp 97.6 F (36.4 C)   Resp 18   SpO2 98%   Visual Acuity Right Eye Distance:   Left Eye Distance:   Bilateral Distance:    Right Eye Near:   Left Eye Near:    Bilateral Near:     Physical Exam Vitals and nursing note reviewed.  Constitutional:      General: He is not in acute distress.    Appearance: Normal appearance. He is not ill-appearing.  HENT:     Head:  Normocephalic and atraumatic.  Eyes:     Conjunctiva/sclera: Conjunctivae normal.  Cardiovascular:     Rate and Rhythm: Normal rate.  Pulmonary:     Effort: Pulmonary effort is normal. No respiratory distress.  Neurological:  Mental Status: He is alert.  Psychiatric:        Mood and Affect: Mood normal.        Behavior: Behavior normal.        Thought Content: Thought content normal.      UC Treatments / Results  Labs (all labs ordered are listed, but only abnormal results are displayed) Labs Reviewed  POCT URINALYSIS DIP (MANUAL ENTRY) - Abnormal; Notable for the following components:      Result Value   Spec Grav, UA >=1.030 (*)    Blood, UA trace-intact (*)    Protein Ur, POC =30 (*)    All other components within normal limits  POCT FASTING CBG [facility] MANUAL ENTRY - Abnormal; Notable for the following components:   POCT Glucose ([facility]) 134 (*)    All other components within normal limits    EKG   Radiology CT HEAD WO CONTRAST  Result Date: 12/04/2022 CLINICAL DATA:  Vertigo, central EXAM: CT HEAD WITHOUT CONTRAST TECHNIQUE: Contiguous axial images were obtained from the base of the skull through the vertex without intravenous contrast. RADIATION DOSE REDUCTION: This exam was performed according to the departmental dose-optimization program which includes automated exposure control, adjustment of the mA and/or kV according to patient size and/or use of iterative reconstruction technique. COMPARISON:  CT head 09/13/18 FINDINGS: Brain: No evidence of acute infarction, hemorrhage, hydrocephalus, extra-axial collection or mass lesion/mass effect. Sequela of mild chronic microvascular ischemic change. Vascular: No hyperdense vessel or unexpected calcification. Skull: Normal. Negative for fracture or focal lesion. Sinuses/Orbits: No middle ear or mastoid effusion. Paranasal sinuses are clear. Bilateral lens replacement. Orbits are otherwise unremarkable. Other: None. IMPRESSION: No  acute intracranial abnormality. Electronically Signed   By: Lorenza Cambridge M.D.   On: 12/04/2022 13:07    Procedures Procedures (including critical care time)  Medications Ordered in UC Medications - No data to display  Initial Impression / Assessment and Plan / UC Course  I have reviewed the triage vital signs and the nursing notes.  Pertinent labs & imaging results that were available during my care of the patient were reviewed by me and considered in my medical decision making (see chart for details).    EKG similar to prior. UA and CBG normal. Given new onset dizziness with no history of vertigo and patient's age and comorbidities recommended further evaluation in the emergency room for stat imaging and labs.  Patient is agreeable to same and wife will transport via POV.  Final Clinical Impressions(s) / UC Diagnoses   Final diagnoses:  Dizziness and giddiness   Discharge Instructions   None    ED Prescriptions   None    PDMP not reviewed this encounter.   Tomi Bamberger, PA-C 12/04/22 1422

## 2022-12-04 NOTE — ED Notes (Signed)
Lurena Joiner, pa at bedside

## 2022-12-04 NOTE — ED Notes (Signed)
Pt went to MRI.

## 2022-12-04 NOTE — ED Triage Notes (Signed)
Dizziness and nausea/vomiting started Sunday.  Denies chest pain

## 2022-12-04 NOTE — ED Provider Notes (Signed)
-----------------------------------------   3:10 PM on 12/04/2022 -----------------------------------------  Blood pressure (!) 154/83, pulse 90, temperature 98.3 F (36.8 C), temperature source Oral, resp. rate 11, height 5\' 10"  (1.778 m), weight 88 kg, SpO2 94%.  Assuming care from Dr. Derrill Kay.  In short, Calvin Wallace is a 82 y.o. male with a chief complaint of Dizziness and Emesis .  Refer to the original H&P for additional details.  The current plan of care is to follow-up MRI results and reassess following symptomatic treatment.  ----------------------------------------- 5:04 PM on 12/04/2022 ----------------------------------------- MRI brain negative for acute process, no evidence of stroke.  Patient ports feeling better on reassessment and is appropriate for discharge home with PCP follow-up.  He will be prescribed meclizine and Zofran, was counseled to return to the ED for new or worsening symptoms.  Patient and spouse agree with plan.    Chesley Noon, MD 12/04/22 [phone]

## 2022-12-04 NOTE — ED Triage Notes (Addendum)
Pt here with dizziness and emesis since yesterday evening. Pt states the dizziness has been intermittent but has been consistent since he got up today. Pt states he vomited this morning. Pt denies pain. Pt states he is having trouble walking due to the dizziness. Pt denies weakness.

## 2022-12-11 NOTE — Progress Notes (Unsigned)
  Electrophysiology Office Note:    Date:  12/12/2022   ID:  Calvin Wallace, DOB 15-Jun-1940, MRN [id number]  [facility] Cardiologist:  Debbe Odea, MD  [facility] Electrophysiologist:  Lanier Prude, MD   Referring MD: Debbe Odea, MD   Chief Complaint: PVCs  History of Present Illness:    Calvin Wallace is a 82 y.o. malewho I am seeing today for an evaluation of PVCs at the request of Dr. Azucena Cecil.  The patient was last seen by Dr. Azucena Cecil on October 03, 2022.  The patient has a medical history that includes hyperlipidemia, PVCs, GERD, obstructive sleep apnea on CPAP.  He has worn a heart monitor in the past which showed a 19% burden of PVCs.  Normal EF.  Asymptomatic.  He was seen in the emergency department December 04, 2022 for dizziness.  He was dizzy when he woke up from sleep.  No syncope.  He is doing very well.  He is very active.  No limitations when he exerts himself.  No syncope or presyncope.  He does not notice any of the palpitations Or PVCs.     Their past medical, social and family history was reveiwed.   ROS:   Please see the history of present illness.    All other systems reviewed and are negative.  EKGs/Labs/Other Studies Reviewed:    The following studies were reviewed today:  Aug 31, 2022 echo EF 60 to 65% RV function normal Mildly dilated left atrium Mild MR  Aug 31, 2022 ZIO monitor personally reviewed 19% burden of PVCs  July 31, 2022 EKG shows sinus rhythm with monomorphic PVCs.  PVCs have a left superior axis.       Physical Exam:    VS:  BP 122/64   Pulse (!) 104   Ht 5' 10.5" (1.791 m)   Wt 200 lb 12.8 oz (91.1 kg)   SpO2 92%   BMI 28.40 kg/m     Wt Readings from Last 3 Encounters:  12/12/22 200 lb 12.8 oz (91.1 kg)  12/04/22 194 lb 0.1 oz (88 kg)  10/03/22 194 lb (88 kg)     GEN:  Well nourished, well developed in no acute distress CARDIAC: RRR, no murmurs, rubs, gallops RESPIRATORY:  Clear to  auscultation without rales, wheezing or rhonchi       ASSESSMENT AND PLAN:    1. PVC (premature ventricular contraction)   2. Primary hypertension     #PVCs Asymptomatic and associated with a normal ejection fraction.  For now, I have recommended no aggressive medical therapy for his PVCs.  Addition of nodal blockers or antiarrhythmics will be difficult given his resting bradycardia.  #Hypertension At goal today.  Recommend checking blood pressures 1-2 times per week at home and recording the values.  Recommend bringing these recordings to the primary care physician. Continue lisinopril, amlodipine    Follow-up with EP on an as-needed basis.     Signed, Rossie Muskrat. Lalla Brothers, MD, [facility], [facility] 12/12/2022 8:26 AM    Electrophysiology [facility]

## 2022-12-12 ENCOUNTER — Encounter: Payer: Self-pay | Admitting: Cardiology

## 2022-12-12 ENCOUNTER — Ambulatory Visit: Payer: Medicare PPO | Attending: Cardiology | Admitting: Cardiology

## 2022-12-12 VITALS — BP 122/64 | HR 104 | Ht 70.5 in | Wt 200.8 lb

## 2022-12-12 DIAGNOSIS — I10 Essential (primary) hypertension: Secondary | ICD-10-CM

## 2022-12-12 DIAGNOSIS — I493 Ventricular premature depolarization: Secondary | ICD-10-CM

## 2022-12-12 NOTE — Patient Instructions (Signed)
Medication Instructions:  Your physician recommends that you continue on your current medications as directed. Please refer to the Current Medication list given to you today.  *If you need a refill on your cardiac medications before your next appointment, please call your pharmacy*  Follow-Up: At [facility], you and your health needs are our priority.  As part of our continuing mission to provide you with exceptional heart care, we have created designated Provider Care Teams.  These Care Teams include your primary Cardiologist (physician) and Advanced Practice Providers (APPs -  Physician Assistants and Nurse Practitioners) who all work together to provide you with the care you need, when you need it.  Your next appointment:   As needed with Dr. Lambert 

## 2023-03-20 ENCOUNTER — Encounter: Payer: Self-pay | Admitting: Gastroenterology

## 2023-04-02 ENCOUNTER — Encounter: Payer: Self-pay | Admitting: Gastroenterology

## 2023-04-04 ENCOUNTER — Ambulatory Visit: Payer: Medicare PPO | Attending: Cardiology | Admitting: Cardiology

## 2023-04-04 ENCOUNTER — Encounter: Payer: Self-pay | Admitting: Cardiology

## 2023-04-04 VITALS — BP 124/86 | HR 68 | Ht 70.5 in | Wt 199.4 lb

## 2023-04-04 DIAGNOSIS — E7800 Pure hypercholesterolemia, unspecified: Secondary | ICD-10-CM | POA: Diagnosis not present

## 2023-04-04 DIAGNOSIS — I493 Ventricular premature depolarization: Secondary | ICD-10-CM

## 2023-04-04 DIAGNOSIS — I10 Essential (primary) hypertension: Secondary | ICD-10-CM

## 2023-04-04 NOTE — Patient Instructions (Signed)
Medication Instructions:  Your Physician recommend you continue on your current medication as directed.    *If you need a refill on your cardiac medications before your next appointment, please call your pharmacy*   Lab Work: None ordered today.  If you have labs (blood work) drawn today and your tests are completely normal, you will receive your results only by: MyChart Message (if you have MyChart) OR A paper copy in the mail If you have any lab test that is abnormal or we need to change your treatment, we will call you to review the results.   Testing/Procedures: None ordered today.    Follow-Up: At [facility], you and your health needs are our priority.  As part of our continuing mission to provide you with exceptional heart care, we have created designated Provider Care Teams.  These Care Teams include your primary Cardiologist (physician) and Advanced Practice Providers (APPs -  Physician Assistants and Nurse Practitioners) who all work together to provide you with the care you need, when you need it.  We recommend signing up for the patient portal called "MyChart".  Sign up information is provided on this After Visit Summary.  MyChart is used to connect with patients for Virtual Visits (Telemedicine).  Patients are able to view lab/test results, encounter notes, upcoming appointments, etc.  Non-urgent messages can be sent to your provider as well.   To learn more about what you can do with MyChart, go to [email or web address].    Your next appointment:   12 month(s)  Provider:   You may see Debbe Odea, MD or one of the following Advanced Practice Providers on your designated Care Team:   Nicolasa Ducking, NP Eula Listen, PA-C Cadence Fransico Michael, PA-C Charlsie Quest, NP Carlos Levering, NP

## 2023-04-04 NOTE — Progress Notes (Signed)
Cardiology Office Note:    Date:  04/04/2023   ID:  Calvin Wallace, DOB [birth year], MRN [id number]  PCP:  Dorothey Baseman, MD   [facility] Providers Cardiologist:  Debbe Odea, MD Electrophysiologist:  Lanier Prude, MD     Referring MD: Dorothey Baseman, MD   Chief Complaint  Patient presents with   Follow-up    Patient denies new or acute cardiac problems/concerns today.      History of Present Illness:    Calvin Wallace is a 82 y.o. male with a hx of hypertension, hyperlipidemia, frequent PVCs, GERD, OSA on CPAP who presents for follow-up.  Being seen for frequent PVCs and hypertension, previous echo showed normal EF with no significant structural abnormalities.  Denies palpitations, dizziness or syncope.  BP adequately controlled on current medications.  Exercises frequently with no adverse effects.  Prior notes/testing Echo 08/2022 EF 60 to 65% Cardiac monitor 07/2022, frequent PVCs, 19% burden.  No sustained arrhythmias.   Past Medical History:  Diagnosis Date   Arthritis    Benign prostatic hyperplasia without lower urinary tract symptoms    Carpal tunnel syndrome, right    Diverticulosis    GERD (gastroesophageal reflux disease)    Headache    migraines   History of kidney stones    Hypercholesteremia    Hyperlipidemia    Hypertension    Insomnia    Migraines    Sleep apnea    OSA---USE C-PAP   Umbilical hernia     Past Surgical History:  Procedure Laterality Date   BUNIONECTOMY Left    CARDIAC CATHETERIZATION     CATARACT EXTRACTION W/PHACO Left 06/13/2022   Procedure: CATARACT EXTRACTION PHACO AND INTRAOCULAR LENS PLACEMENT (IOC) LEFT 5.53 0:39.9;  Surgeon: Galen Manila, MD;  Location: [facility];  Service: Ophthalmology;  Laterality: Left;   CATARACT EXTRACTION W/PHACO Right 06/27/2022   Procedure: CATARACT EXTRACTION PHACO AND INTRAOCULAR LENS PLACEMENT (IOC) RIGHT 5.87 00:38.3;  Surgeon: Galen Manila, MD;  [facility]  [facility];  Service: Ophthalmology;  Laterality: Right;   COLONOSCOPY     COLONOSCOPY WITH PROPOFOL N/A 07/20/2017   Procedure: COLONOSCOPY WITH PROPOFOL;  Surgeon: Scot Jun, MD;  Location: [facility] END[facility]COPY;  Service: Endoscopy;  Laterality: N/A;   ESOPHAGOGASTRODUODENOSCOPY (EGD) WITH PROPOFOL N/A 07/20/2017   Procedure: ESOPHAGOGASTRODUODENOSCOPY (EGD) WITH PROPOFOL;  Surgeon: Scot Jun, MD;  Location: [facility] ENDOSCOPY;  Service: Endoscopy;  Laterality: N/A;   HERNIA REPAIR Right    Inguinal Hernia Repair   HERNIA REPAIR     Umbilical Hernia Repair   KNEE ARTHROSCOPY Right 09/21/2016   Procedure: ARTHROSCOPY KNEE MEDIAL MENISCECTOMY;  Surgeon: Kennedy Bucker, MD;  Location: [facility] OR[facility];  Service: Orthopedics;  Laterality: Right;   LITHOTRIPSY     SHOULDER ARTHROSCOPY WITH ROTATOR CUFF REPAIR Right 1998   TONSILLECTOMY     Age 61   UMBILICAL HERNIA REPAIR      Current Medications: Current Meds  Medication Sig   amLODipine (NORVASC) 5 MG tablet Take 5 mg by mouth daily.   aspirin EC 81 MG tablet Take 81 mg by mouth daily. Swallow whole.   desonide (DESOWEN) 0.05 % cream Apply topically as needed.   diclofenac sodium (VOLTAREN) 1 % GEL Apply 1 application topically 2 (two) times daily as needed (pain).   doxepin (SINEQUAN) 10 MG capsule Take 10 mg by mouth at bedtime.   finasteride (PROSCAR) 5 MG tablet Take 5 mg by mouth every evening.   ketoconazole (NIZORAL) 2 % cream  Apply 1 application topically once a week.   lisinopril (PRINIVIL,ZESTRIL) 40 MG tablet Take 40 mg by mouth every evening.   meclizine (ANTIVERT) 25 MG tablet Take 1 tablet (25 mg total) by mouth 3 (three) times daily as needed for dizziness.   ondansetron (ZOFRAN-ODT) 4 MG disintegrating tablet Take 1 tablet (4 mg total) by mouth every 8 (eight) hours as needed for nausea or vomiting.   pravastatin (PRAVACHOL) 40 MG tablet Take 40 mg by mouth every evening.   tamsulosin (FLOMAX) 0.4 MG CAPS capsule  Take 0.4 mg by mouth daily.     Allergies:   Chocolate flavoring agent (non-screening), Mushroom extract complex, Amino acids, and Cheese   Social History   Socioeconomic History   Marital status: Married    Spouse name: Not on file   Number of children: Not on file   Years of education: Not on file   Highest education level: Not on file  Occupational History   Not on file  Tobacco Use   Smoking status: Never   Smokeless tobacco: Never  Vaping Use   Vaping status: Never Used  Substance and Sexual Activity   Alcohol use: No   Drug use: No   Sexual activity: Not on file    Comment: Married   Other Topics Concern   Not on file  Social History Narrative   Not on file   Social Determinants of Health   Financial Resource Strain: Low Risk  (07/19/2022)   Received from [facility], [facility]   Overall Financial Resource Strain (CARDIA)    Difficulty of Paying Living Expenses: Not hard at all  Food Insecurity: No Food Insecurity (07/19/2022)   Received from [facility], [facility]   Hunger Vital Sign    Worried About Running Out of Food in the Last Year: Never true    Ran Out of Food in the Last Year: Never true  Transportation Needs: No Transportation Needs (07/19/2022)   Received from [facility], [facility]   [facility] - Transportation    In the past 12 months, has lack of transportation kept you from medical appointments or from getting medications?: No    Lack of Transportation (Non-Medical): No  Physical Activity: Not on file  Stress: Not on file  Social Connections: Not on file     Family History: The patient's family history includes Breast cancer in his mother; Colon polyps in his sister; Diabetes in his father; Hypertension in his father; Prostate cancer in his father; Stroke in his father.  ROS:   Please see the history of present illness.     All other  systems reviewed and are negative.  EKGs/Labs/Other Studies Reviewed:    The following studies were reviewed today:   EKG Interpretation Date/Time:  Wednesday April 04 2023 09:11:07 EST Ventricular Rate:  68 PR Interval:  178 QRS Duration:  154 QT Interval:  410 QTC Calculation: 435 R Axis:   7  Text Interpretation: Normal sinus rhythm Right bundle branch block Confirmed by Debbe Odea ([id number]) on 04/04/2023 9:30:12 AM     Recent Labs: 12/04/2022: BUN 22; Creatinine, Ser 1.04; Hemoglobin 16.4; Platelets 167; Potassium 4.1; Sodium 139  Recent Lipid Panel No results found for: "CHOL", "TRIG", "HDL", "CHOLHDL", "VLDL", "LDLCALC", "LDLDIRECT"  Outside lipid panel 01/19/2019 total cholesterol 153, triglycerides 86, LDL 92, HDL 43.6  Risk Assessment/Calculations:  Physical Exam:    VS:  BP 124/86 (BP Location: Left Arm, Patient Position: Sitting, Cuff Size: Normal)   Pulse 68   Ht 5' 10.5" (1.791 m)   Wt 199 lb 6.4 oz (90.4 kg)   SpO2 98%   BMI 28.21 kg/m     Wt Readings from Last 3 Encounters:  04/04/23 199 lb 6.4 oz (90.4 kg)  12/12/22 200 lb 12.8 oz (91.1 kg)  12/04/22 194 lb 0.1 oz (88 kg)     GEN:  Well nourished, well developed in no acute distress HEENT: Normal NECK: No JVD; No carotid bruits CARDIAC: RRR, no murmurs, rubs, gallops RESPIRATORY:  Clear to auscultation without rales, wheezing or rhonchi  ABDOMEN: Soft, non-tender, non-distended MUSCULOSKELETAL:  No edema; No deformity  SKIN: Warm and dry NEUROLOGIC:  Alert and oriented x 3 PSYCHIATRIC:  Normal affect   ASSESSMENT:    1. PVC (premature ventricular contraction)   2. Primary hypertension   3. Pure hypercholesterolemia     PLAN:    In order of problems listed above:  Frequent PVCs, 19% burden on monitor.  Echo with normal EF 60 to 65%.  Patient otherwise asymptomatic.  Heart rate 68.  Continue to monitor.  Avoid beta-blockers with low normal heart  rate. Hypertension, BP controlled, continue lisinopril 40 mg, Norvasc 5 mg. Hyperlipidemia, continue Pravachol 40 mg daily  Follow-up in 12 months.      Medication Adjustments/Labs and Tests Ordered: Current medicines are reviewed at length with the patient today.  Concerns regarding medicines are outlined above.  Orders Placed This Encounter  Procedures   EKG 12-Lead   No orders of the defined types were placed in this encounter.   Patient Instructions  Medication Instructions:  Your Physician recommend you continue on your current medication as directed.    *If you need a refill on your cardiac medications before your next appointment, please call your pharmacy*   Lab Work: None ordered today.  If you have labs (blood work) drawn today and your tests are completely normal, you will receive your results only by: MyChart Message (if you have MyChart) OR A paper copy in the mail If you have any lab test that is abnormal or we need to change your treatment, we will call you to review the results.   Testing/Procedures: None ordered today.    Follow-Up: At [facility], you and your health needs are our priority.  As part of our continuing mission to provide you with exceptional heart care, we have created designated Provider Care Teams.  These Care Teams include your primary Cardiologist (physician) and Advanced Practice Providers (APPs -  Physician Assistants and Nurse Practitioners) who all work together to provide you with the care you need, when you need it.  We recommend signing up for the patient portal called "MyChart".  Sign up information is provided on this After Visit Summary.  MyChart is used to connect with patients for Virtual Visits (Telemedicine).  Patients are able to view lab/test results, encounter notes, upcoming appointments, etc.  Non-urgent messages can be sent to your provider as well.   To learn more about what you can do with MyChart, go to  [email or web address].    Your next appointment:   12 month(s)  Provider:   You may see Debbe Odea, MD or one of the following Advanced Practice Providers on your designated Care Team:   Nicolasa Ducking, NP Eula Listen, PA-C Cadence Fransico Michael, PA-C Charlsie Quest, NP Carlos Levering, NP  Signed, Debbe Odea, MD  04/04/2023 9:49 AM    [facility]

## 2023-04-06 ENCOUNTER — Encounter: Payer: Self-pay | Admitting: Gastroenterology

## 2023-04-09 ENCOUNTER — Ambulatory Visit: Payer: Medicare PPO | Admitting: Anesthesiology

## 2023-04-09 ENCOUNTER — Encounter: Payer: Self-pay | Admitting: Gastroenterology

## 2023-04-09 ENCOUNTER — Encounter
Admission: RE | Disposition: A | Discharge status: Home / Self Care | Payer: Self-pay | Source: Home / Self Care | Attending: Gastroenterology

## 2023-04-09 ENCOUNTER — Ambulatory Visit
Admission: RE | Admit: 2023-04-09 | Discharge: 2023-04-09 | Disposition: A | Discharge status: Home / Self Care | Payer: Medicare PPO | Attending: Gastroenterology | Admitting: Gastroenterology

## 2023-04-09 DIAGNOSIS — Z1211 Encounter for screening for malignant neoplasm of colon: Secondary | ICD-10-CM | POA: Diagnosis present

## 2023-04-09 DIAGNOSIS — D122 Benign neoplasm of ascending colon: Secondary | ICD-10-CM | POA: Diagnosis not present

## 2023-04-09 DIAGNOSIS — K5730 Diverticulosis of large intestine without perforation or abscess without bleeding: Secondary | ICD-10-CM | POA: Diagnosis not present

## 2023-04-09 DIAGNOSIS — I080 Rheumatic disorders of both mitral and aortic valves: Secondary | ICD-10-CM | POA: Diagnosis not present

## 2023-04-09 DIAGNOSIS — K219 Gastro-esophageal reflux disease without esophagitis: Secondary | ICD-10-CM | POA: Diagnosis not present

## 2023-04-09 DIAGNOSIS — K640 First degree hemorrhoids: Secondary | ICD-10-CM | POA: Insufficient documentation

## 2023-04-09 DIAGNOSIS — K635 Polyp of colon: Secondary | ICD-10-CM | POA: Insufficient documentation

## 2023-04-09 DIAGNOSIS — I10 Essential (primary) hypertension: Secondary | ICD-10-CM | POA: Diagnosis not present

## 2023-04-09 HISTORY — PX: POLYPECTOMY: SHX5525

## 2023-04-09 HISTORY — PX: COLONOSCOPY WITH PROPOFOL: SHX5780

## 2023-04-09 HISTORY — DX: Carpal tunnel syndrome, right upper limb: G56.01

## 2023-04-09 HISTORY — DX: Diverticulosis of intestine, part unspecified, without perforation or abscess without bleeding: K57.90

## 2023-04-09 HISTORY — DX: Benign prostatic hyperplasia without lower urinary tract symptoms: N40.0

## 2023-04-09 HISTORY — DX: Insomnia, unspecified: G47.00

## 2023-04-09 SURGERY — COLONOSCOPY WITH PROPOFOL
Anesthesia: General

## 2023-04-09 MED ORDER — PROPOFOL 10 MG/ML IV BOLUS
INTRAVENOUS | Status: DC | PRN
Start: 2023-04-09 — End: 2023-04-09
  Administered 2023-04-09: 80 mg via INTRAVENOUS
  Administered 2023-04-09: 120 ug/kg/min via INTRAVENOUS

## 2023-04-09 MED ORDER — PROPOFOL 10 MG/ML IV BOLUS
INTRAVENOUS | Status: AC
  Filled 2023-04-09: qty 40

## 2023-04-09 MED ORDER — LIDOCAINE HCL (PF) 2 % IJ SOLN
INTRAMUSCULAR | Status: AC
  Filled 2023-04-09: qty 5

## 2023-04-09 MED ORDER — PHENYLEPHRINE 80 MCG/ML (10ML) SYRINGE FOR IV PUSH (FOR BLOOD PRESSURE SUPPORT)
PREFILLED_SYRINGE | INTRAVENOUS | Status: DC | PRN
  Administered 2023-04-09 (×2): 80 ug via INTRAVENOUS

## 2023-04-09 MED ORDER — SODIUM CHLORIDE 0.9 % IV SOLN: INTRAVENOUS | Status: DC

## 2023-04-09 MED ORDER — LIDOCAINE HCL (CARDIAC) PF 100 MG/5ML IV SOSY
PREFILLED_SYRINGE | INTRAVENOUS | Status: DC | PRN
  Administered 2023-04-09: 40 mg via INTRAVENOUS

## 2023-04-09 NOTE — H&P (Signed)
Pre-Procedure H&P   Patient ID: Calvin Wallace is a 82 y.o. male.  Gastroenterology Provider: Jaynie Collins, DO  Referring Provider: Fransico Setters, NP PCP: Dorothey Baseman, MD  Date: 04/09/2023  HPI Calvin Wallace is a 82 y.o. male who presents today for Colonoscopy for Personal history of colon polyps .  Last underwent colonoscopy in March 2019 with left-sided diverticulosis and internal hemorrhoids.  Colonoscopy in February 2010 normal, previous colonoscopy in [place] reportedly with polyps.  Family history of colon polyps in sister   Past Medical History:  Diagnosis Date   Arthritis    Benign prostatic hyperplasia without lower urinary tract symptoms    Carpal tunnel syndrome, right    Diverticulosis    GERD (gastroesophageal reflux disease)    Headache    migraines   History of kidney stones    Hypercholesteremia    Hyperlipidemia    Hypertension    Insomnia    Migraines    Sleep apnea    OSA---USE C-PAP   Umbilical hernia     Past Surgical History:  Procedure Laterality Date   BUNIONECTOMY Left    CARDIAC CATHETERIZATION     CATARACT EXTRACTION W/PHACO Left 06/13/2022   Procedure: CATARACT EXTRACTION PHACO AND INTRAOCULAR LENS PLACEMENT (IOC) LEFT 5.53 0:39.9;  Surgeon: Galen Manila, MD;  Location: [facility];  Service: Ophthalmology;  Laterality: Left;   CATARACT EXTRACTION W/PHACO Right 06/27/2022   Procedure: CATARACT EXTRACTION PHACO AND INTRAOCULAR LENS PLACEMENT (IOC) RIGHT 5.87 00:38.3;  Surgeon: Galen Manila, MD;  Location: [facility];  Service: Ophthalmology;  Laterality: Right;   COLONOSCOPY     COLONOSCOPY WITH PROPOFOL N/A 07/20/2017   Procedure: COLONOSCOPY WITH PROPOFOL;  Surgeon: Scot Jun, MD;  Location: [facility] ENDOSCOPY;  Service: Endoscopy;  Laterality: N/A;   ESOPHAGOGASTRODUODENOSCOPY (EGD) WITH PROPOFOL N/A 07/20/2017   Procedure: ESOPHAGOGASTRODUODENOSCOPY (EGD) WITH PROPOFOL;  Surgeon: Scot Jun, MD;  Location: [facility] ENDOSCOPY;  Service: Endoscopy;  Laterality: N/A;   HERNIA REPAIR Right    Inguinal Hernia Repair   HERNIA REPAIR     Umbilical Hernia Repair   KNEE ARTHROSCOPY Right 09/21/2016   Procedure: ARTHROSCOPY KNEE MEDIAL MENISCECTOMY;  Surgeon: Kennedy Bucker, MD;  Location: [facility] ORS;  Service: Orthopedics;  Laterality: Right;   LITHOTRIPSY     SHOULDER ARTHROSCOPY WITH ROTATOR CUFF REPAIR Right 1998   TONSILLECTOMY     Age 20   UMBILICAL HERNIA REPAIR      Family History Sister- polyps No h/o GI disease or malignancy  Review of Systems  Constitutional:  Negative for activity change, appetite change, chills, diaphoresis, fatigue, fever and unexpected weight change.  HENT:  Negative for trouble swallowing and voice change.   Respiratory:  Negative for shortness of breath and wheezing.   Cardiovascular:  Negative for chest pain, palpitations and leg swelling.  Gastrointestinal:  Negative for abdominal distention, abdominal pain, anal bleeding, blood in stool, constipation, diarrhea, nausea and vomiting.  Musculoskeletal:  Negative for arthralgias and myalgias.  Skin:  Negative for color change and pallor.  Neurological:  Negative for dizziness, syncope and weakness.  Psychiatric/Behavioral:  Negative for confusion. The patient is not nervous/anxious.   All other systems reviewed and are negative.    Medications No current facility-administered medications on file prior to encounter.   Current Outpatient Medications on File Prior to Encounter  Medication Sig Dispense Refill   amLODipine (NORVASC) 5 MG tablet Take 5 mg by mouth daily.     aspirin EC  81 MG tablet Take 81 mg by mouth daily. Swallow whole.     desonide (DESOWEN) 0.05 % cream Apply topically as needed.     diclofenac sodium (VOLTAREN) 1 % GEL Apply 1 application topically 2 (two) times daily as needed (pain).     doxepin (SINEQUAN) 10 MG capsule Take 10 mg by mouth at bedtime.     finasteride  (PROSCAR) 5 MG tablet Take 5 mg by mouth every evening.     ketoconazole (NIZORAL) 2 % cream Apply 1 application topically once a week.     lisinopril (PRINIVIL,ZESTRIL) 40 MG tablet Take 40 mg by mouth every evening.     meclizine (ANTIVERT) 25 MG tablet Take 1 tablet (25 mg total) by mouth 3 (three) times daily as needed for dizziness. 30 tablet 0   meloxicam (MOBIC) 7.5 MG tablet Take 1 tablet by mouth 2 (two) times daily with a meal. (Patient not taking: Reported on 12/04/2022)     omeprazole (PRILOSEC) 20 MG capsule Take 20 mg by mouth daily. (Patient not taking: Reported on 07/31/2022)     ondansetron (ZOFRAN-ODT) 4 MG disintegrating tablet Take 1 tablet (4 mg total) by mouth every 8 (eight) hours as needed for nausea or vomiting. 12 tablet 0   pravastatin (PRAVACHOL) 40 MG tablet Take 40 mg by mouth every evening.     SUMAtriptan (IMITREX) 6 MG/0.5ML SOLN injection Inject 6 mg into the skin every 2 (two) hours as needed for migraine or headache. May repeat in 2 hours if headache persists or recurs. (Patient not taking: Reported on 04/04/2023)     tamsulosin (FLOMAX) 0.4 MG CAPS capsule Take 0.4 mg by mouth daily.      Pertinent medications related to GI and procedure were reviewed by me with the patient prior to the procedure   Current Facility-Administered Medications:    0.9 %  sodium chloride infusion, , Intravenous, Continuous, Jaynie Collins, DO  sodium chloride         Allergies  Allergen Reactions   Chocolate Flavoring Agent (Non-Screening) Other (See Comments)    Migraine   Mushroom Extract Complex (Do Not Select) Other (See Comments)    Migraine   Amino Acids Other (See Comments)    Chocolate                            Triggers migraines  Cheese Fresh Mushrooms Peanuts Red Wine Beer    Cheese Other (See Comments)    AGED  Migraine   Allergies were reviewed by me prior to the procedure  Objective   Body mass index is 27.44 kg/m. Vitals:   04/09/23 0841  04/09/23 0858  BP:  (!) 139/97  Pulse:  92  Resp:  16  Temp:  (!) 96.6 F (35.9 C)  TempSrc:  Temporal  SpO2:  97%  Weight: 88 kg   Height: 5' 10.5" (1.791 m)      Physical Exam Vitals and nursing note reviewed.  Constitutional:      General: He is not in acute distress.    Appearance: Normal appearance. He is not ill-appearing, toxic-appearing or diaphoretic.  HENT:     Head: Normocephalic and atraumatic.     Nose: Nose normal.     Mouth/Throat:     Mouth: Mucous membranes are moist.     Pharynx: Oropharynx is clear.  Eyes:     General: No scleral icterus.    Extraocular Movements: Extraocular movements intact.  Cardiovascular:     Rate and Rhythm: Normal rate and regular rhythm.     Heart sounds: Normal heart sounds. No murmur heard.    No friction rub. No gallop.  Pulmonary:     Effort: Pulmonary effort is normal. No respiratory distress.     Breath sounds: Normal breath sounds. No wheezing, rhonchi or rales.  Abdominal:     General: Bowel sounds are normal. There is no distension.     Palpations: Abdomen is soft.     Tenderness: There is no abdominal tenderness. There is no guarding or rebound.  Musculoskeletal:     Cervical back: Neck supple.     Right lower leg: No edema.     Left lower leg: No edema.  Skin:    General: Skin is warm and dry.     Coloration: Skin is not jaundiced or pale.  Neurological:     General: No focal deficit present.     Mental Status: He is alert and oriented to person, place, and time. Mental status is at baseline.  Psychiatric:        Mood and Affect: Mood normal.        Behavior: Behavior normal.        Thought Content: Thought content normal.        Judgment: Judgment normal.      Assessment:  Calvin Wallace is a 82 y.o. male  who presents today for Colonoscopy for Personal history of colon polyps .  Plan:  Colonoscopy with possible intervention today  Colonoscopy with possible biopsy, control of bleeding,  polypectomy, and interventions as necessary has been discussed with the patient/patient representative. Informed consent was obtained from the patient/patient representative after explaining the indication, nature, and risks of the procedure including but not limited to death, bleeding, perforation, missed neoplasm/lesions, cardiorespiratory compromise, and reaction to medications. Opportunity for questions was given and appropriate answers were provided. Patient/patient representative has verbalized understanding is amenable to undergoing the procedure.   Jaynie Collins, DO  [facility]  Portions of the record may have been created with voice recognition software. Occasional wrong-word or 'sound-a-like' substitutions may have occurred due to the inherent limitations of voice recognition software.  Read the chart carefully and recognize, using context, where substitutions may have occurred.

## 2023-04-09 NOTE — Anesthesia Postprocedure Evaluation (Signed)
Anesthesia Post Note  Patient: Euriah Barrick  Procedure(s) Performed: COLONOSCOPY WITH PROPOFOL POLYPECTOMY  Patient location during evaluation: Endoscopy Anesthesia Type: General Level of consciousness: awake and alert Pain management: pain level controlled Vital Signs Assessment: post-procedure vital signs reviewed and stable Respiratory status: spontaneous breathing, nonlabored ventilation, respiratory function stable and patient connected to nasal cannula oxygen Cardiovascular status: blood pressure returned to baseline and stable Postop Assessment: no apparent nausea or vomiting Anesthetic complications: no   No notable events documented.   Last Vitals:  Vitals:   04/09/23 1015 04/09/23 1025  BP: 113/74 128/82  Pulse: 88 81  Resp: 17 13  Temp:    SpO2: 97% 98%    Last Pain:  Vitals:   04/09/23 1025  TempSrc:   PainSc: 0-No pain                 Louie Boston

## 2023-04-09 NOTE — Anesthesia Preprocedure Evaluation (Addendum)
Anesthesia Evaluation  Patient identified by MRN, date of birth, ID band Patient awake    Reviewed: Allergy & Precautions, NPO status , Patient's Chart, lab work & pertinent test results  History of Anesthesia Complications Negative for: history of anesthetic complications  Airway Mallampati: II  TM Distance: >3 FB Neck ROM: full    Dental no notable dental hx.    Pulmonary sleep apnea and Continuous Positive Airway Pressure Ventilation    Pulmonary exam normal        Cardiovascular hypertension, On Medications Normal cardiovascular exam+ dysrhythmias (PVCs)   Echo 5/24 IMPRESSIONS     1. Left ventricular ejection fraction, by estimation, is 60 to 65%. The  left ventricle has normal function. The left ventricle has no regional  wall motion abnormalities. There is mild left ventricular hypertrophy.  Left ventricular diastolic parameters  are consistent with Grade I diastolic dysfunction (impaired relaxation).  The average left ventricular global longitudinal strain is -20.2 %.   2. Right ventricular systolic function is normal. The right ventricular  size is normal.   3. Left atrial size was mildly dilated.   4. The mitral valve is normal in structure. Mild mitral valve  regurgitation. No evidence of mitral stenosis.   5. The aortic valve is tricuspid. Aortic valve regurgitation is mild. No  aortic stenosis is present.   6. The inferior vena cava is normal in size with greater than 50%  respiratory variability, suggesting right atrial pressure of 3 mmHg.     Neuro/Psych  Headaches  Neuromuscular disease  negative psych ROS   GI/Hepatic Neg liver ROS,GERD  ,,  Endo/Other  negative endocrine ROS    Renal/GU negative Renal ROS  negative genitourinary   Musculoskeletal  (+) Arthritis ,    Abdominal   Peds  Hematology negative hematology ROS (+)   Anesthesia Other Findings Past Medical History: No date:  Arthritis No date: Benign prostatic hyperplasia without lower urinary tract  symptoms No date: Carpal tunnel syndrome, right No date: Diverticulosis No date: GERD (gastroesophageal reflux disease) No date: Headache     Comment:  migraines No date: History of kidney stones No date: Hypercholesteremia No date: Hyperlipidemia No date: Hypertension No date: Insomnia No date: Migraines No date: Sleep apnea     Comment:  OSA---USE C-PAP No date: Umbilical hernia  Past Surgical History: No date: BUNIONECTOMY; Left No date: CARDIAC CATHETERIZATION 06/13/2022: CATARACT EXTRACTION W/PHACO; Left     Comment:  Procedure: CATARACT EXTRACTION PHACO AND INTRAOCULAR               LENS PLACEMENT (IOC) LEFT 5.53 0:39.9;  Surgeon:               Galen Manila, MD;  Location: [facility];                Service: Ophthalmology;  Laterality: Left; 06/27/2022: CATARACT EXTRACTION W/PHACO; Right     Comment:  Procedure: CATARACT EXTRACTION PHACO AND INTRAOCULAR               LENS PLACEMENT (IOC) RIGHT 5.87 00:38.3;  Surgeon:               Galen Manila, MD;  Location: [facility];                Service: Ophthalmology;  Laterality: Right; No date: COLONOSCOPY 07/20/2017: COLONOSCOPY WITH PROPOFOL; N/A     Comment:  Procedure: COLONOSCOPY WITH PROPOFOL;  Surgeon: Mechele Collin,  Wilber Bihari, MD;  Location: [facility] ENDOSCOPY;  Service:               Endoscopy;  Laterality: N/A; 07/20/2017: ESOPHAGOGASTRODUODENOSCOPY (EGD) WITH PROPOFOL; N/A     Comment:  Procedure: ESOPHAGOGASTRODUODENOSCOPY (EGD) WITH               PROPOFOL;  Surgeon: Scot Jun, MD;  Location:               [facility] ENDOSCOPY;  Service: Endoscopy;  Laterality: N/A; No date: HERNIA REPAIR; Right     Comment:  Inguinal Hernia Repair No date: HERNIA REPAIR     Comment:  Umbilical Hernia Repair 09/21/2016: KNEE ARTHROSCOPY; Right     Comment:  Procedure: ARTHROSCOPY KNEE MEDIAL MENISCECTOMY;                 Surgeon: Kennedy Bucker, MD;  Location: [facility] ORS;                Service: Orthopedics;  Laterality: Right; No date: LITHOTRIPSY 1998: SHOULDER ARTHROSCOPY WITH ROTATOR CUFF REPAIR; Right No date: TONSILLECTOMY     Comment:  Age 82 No date: UMBILICAL HERNIA REPAIR  BMI    Body Mass Index: 27.44 kg/m      Reproductive/Obstetrics negative OB ROS                             Anesthesia Physical Anesthesia Plan  ASA: 3  Anesthesia Plan: General   Post-op Pain Management: Minimal or no pain anticipated   Induction: Intravenous  PONV Risk Score and Plan: 2 and Propofol infusion and TIVA  Airway Management Planned: Natural Airway and Nasal Cannula  Additional Equipment:   Intra-op Plan:   Post-operative Plan:   Informed Consent: I have reviewed the patients History and Physical, chart, labs and discussed the procedure including the risks, benefits and alternatives for the proposed anesthesia with the patient or authorized representative who has indicated his/her understanding and acceptance.     Dental Advisory Given  Plan Discussed with: Anesthesiologist, CRNA and Surgeon  Anesthesia Plan Comments: (Patient consented for risks of anesthesia including but not limited to:  - adverse reactions to medications - risk of airway placement if required - damage to eyes, teeth, lips or other oral mucosa - nerve damage due to positioning  - sore throat or hoarseness - Damage to heart, brain, nerves, lungs, other parts of body or loss of life  Patient voiced understanding and assent.)        Anesthesia Quick Evaluation

## 2023-04-09 NOTE — Transfer of Care (Signed)
Immediate Anesthesia Transfer of Care Note  Patient: Calvin Wallace  Procedure(s) Performed: COLONOSCOPY WITH PROPOFOL POLYPECTOMY  Patient Location: PACU  Anesthesia Type:MAC  Level of Consciousness: drowsy  Airway & Oxygen Therapy: Patient Spontanous Breathing  Post-op Assessment: Report given to RN and Post -op Vital signs reviewed and stable  Post vital signs: Reviewed and stable  Last Vitals:  Vitals Value Taken Time  BP 96/69 04/09/23 1006  Temp    Pulse 84 04/09/23 1007  Resp 16 04/09/23 1007  SpO2 97 % 04/09/23 1007  Vitals shown include unfiled device data.  Last Pain:  Vitals:   04/09/23 0858  TempSrc: Temporal  PainSc: 0-No pain         Complications: No notable events documented.

## 2023-04-09 NOTE — Interval H&P Note (Signed)
History and Physical Interval Note: Preprocedure H&P from 04/09/23  was reviewed and there was no interval change after seeing and examining the patient.  Written consent was obtained from the patient after discussion of risks, benefits, and alternatives. Patient has consented to proceed with Colonoscopy with possible intervention   04/09/2023 9:28 AM  Calvin Wallace  has presented today for surgery, with the diagnosis of V12.72 (ICD-9-CM) - Z86.010 (ICD-10-CM) - Personal history of colonic polyps.  The various methods of treatment have been discussed with the patient and family. After consideration of risks, benefits and other options for treatment, the patient has consented to  Procedure(s): COLONOSCOPY WITH PROPOFOL (N/A) as a surgical intervention.  The patient's history has been reviewed, patient examined, no change in status, stable for surgery.  I have reviewed the patient's chart and labs.  Questions were answered to the patient's satisfaction.     Jaynie Collins

## 2023-04-09 NOTE — Op Note (Signed)
[facility] Gastroenterology Patient Name: Calvin Wallace Procedure Date: 04/09/2023 9:24 AM MRN: [id number] Account #: [id number] Date of Birth: 1940-07-25 Admit Type: Outpatient Age: 82 Room: [facility] ENDO ROOM 2 Gender: Male Note Status: Finalized Instrument Name: Colonoscope [id number] Procedure:             Colonoscopy Indications:           High risk colon cancer surveillance: Personal history                         of colonic polyps Providers:             Jaynie Collins DO, DO Medicines:             Monitored Anesthesia Care Complications:         No immediate complications. Estimated blood loss:                         Minimal. Procedure:             Pre-Anesthesia Assessment:                        - Prior to the procedure, a History and Physical was                         performed, and patient medications and allergies were                         reviewed. The patient is competent. The risks and                         benefits of the procedure and the sedation options and                         risks were discussed with the patient. All questions                         were answered and informed consent was obtained.                         Patient identification and proposed procedure were                         verified by the physician, the nurse, the anesthetist                         and the technician in the endoscopy suite. Mental                         Status Examination: alert and oriented. Airway                         Examination: normal oropharyngeal airway and neck                         mobility. Respiratory Examination: clear to                         auscultation. CV Examination: RRR, no murmurs, no S3  or S4. Prophylactic Antibiotics: The patient does not                         require prophylactic antibiotics. Prior                         Anticoagulants: The patient has taken no anticoagulant                          or antiplatelet agents. ASA Grade Assessment: III - A                         patient with severe systemic disease. After reviewing                         the risks and benefits, the patient was deemed in                         satisfactory condition to undergo the procedure. The                         anesthesia plan was to use monitored anesthesia care                         (MAC). Immediately prior to administration of                         medications, the patient was re-assessed for adequacy                         to receive sedatives. The heart rate, respiratory                         rate, oxygen saturations, blood pressure, adequacy of                         pulmonary ventilation, and response to care were                         monitored throughout the procedure. The physical                         status of the patient was re-assessed after the                         procedure.                        After obtaining informed consent, the colonoscope was                         passed under direct vision. Throughout the procedure,                         the patient's blood pressure, pulse, and oxygen                         saturations were monitored continuously. The  Colonoscope was introduced through the anus and                         advanced to the the cecum, identified by appendiceal                         orifice and ileocecal valve. The colonoscopy was                         performed without difficulty. The patient tolerated                         the procedure well. The quality of the bowel                         preparation was evaluated using the BBPS [facility] Bowel                         Preparation Scale) with scores of: Right Colon = 3                         (entire mucosa seen well with no residual staining,                         small fragments of stool or opaque liquid), Transverse                          Colon = 3 (entire mucosa seen well with no residual                         staining, small fragments of stool or opaque liquid)                         and Left Colon = 2 (minor amount of residual staining,                         small fragments of stool and/or opaque liquid, but                         mucosa seen well). The total BBPS score equals 8. The                         quality of the bowel preparation was excellent. The                         ileocecal valve, appendiceal orifice, and rectum were                         photographed. Findings:      The perianal and digital rectal examinations were normal. Pertinent       negatives include normal sphincter tone.      Multiple small-mouthed diverticula were found in the left colon.       Estimated blood loss: none.      Non-bleeding internal hemorrhoids were found during retroflexion. The       hemorrhoids were Grade I (internal hemorrhoids that do not prolapse).  Estimated blood loss: none.      Two sessile polyps were found in the descending colon and ascending       colon. The polyps were 3 to 5 mm in size. These polyps were removed with       a cold snare. Resection and retrieval were complete. Estimated blood       loss was minimal.      Three sessile polyps were found in the recto-sigmoid colon (2) and       transverse colon (1). The polyps were 1 to 2 mm in size. These polyps       were removed with a jumbo cold forceps. Resection and retrieval were       complete. Estimated blood loss was minimal.      The exam was otherwise without abnormality on direct and retroflexion       views. Impression:            - Diverticulosis in the left colon.                        - Non-bleeding internal hemorrhoids.                        - Two 3 to 5 mm polyps in the descending colon and in                         the ascending colon, removed with a cold snare.                         Resected and retrieved.                         - Three 1 to 2 mm polyps at the recto-sigmoid colon                         and in the transverse colon, removed with a jumbo cold                         forceps. Resected and retrieved.                        - The examination was otherwise normal on direct and                         retroflexion views. Recommendation:        - Patient has a contact number available for                         emergencies. The signs and symptoms of potential                         delayed complications were discussed with the patient.                         Return to normal activities tomorrow. Written                         discharge instructions were provided to the patient.                        -  Discharge patient to home.                        - Resume previous diet.                        - Continue present medications.                        - No ibuprofen, naproxen, or other non-steroidal                         anti-inflammatory drugs for 5 days after polyp removal.                        - Await pathology results.                        - Repeat colonoscopy for surveillance based on                         pathology results.                        - Return to referring physician as previously                         scheduled.                        - The findings and recommendations were discussed with                         the patient. Procedure Code(s):     --- Professional ---                        [phone], Colonoscopy, flexible; with removal of                         tumor(s), polyp(s), or other lesion(s) by snare                         technique                        45380, 59, Colonoscopy, flexible; with biopsy, single                         or multiple Diagnosis Code(s):     --- Professional ---                        Z86.010, Personal history of colonic polyps                        K64.0, First degree hemorrhoids                        D12.4, Benign neoplasm of  descending colon                        D12.2, Benign neoplasm of ascending colon  D12.7, Benign neoplasm of rectosigmoid junction                        D12.3, Benign neoplasm of transverse colon (hepatic                         flexure or splenic flexure)                        K57.30, Diverticulosis of large intestine without                         perforation or abscess without bleeding CPT copyright 2022 [employer]. All rights reserved. The codes documented in this report are preliminary and upon coder review may  be revised to meet current compliance requirements. Attending Participation:      I personally performed the entire procedure. Elfredia Nevins, DO Jaynie Collins DO, DO 04/09/2023 10:05:23 AM This report has been signed electronically. Number of Addenda: 0 Note Initiated On: 04/09/2023 9:24 AM Scope Withdrawal Time: 0 hours 16 minutes 44 seconds  Total Procedure Duration: 0 hours 23 minutes 19 seconds  Estimated Blood Loss:  Estimated blood loss was minimal.      [facility]

## 2023-04-10 ENCOUNTER — Encounter: Payer: Self-pay | Admitting: Gastroenterology

## 2023-04-11 LAB — SURGICAL PATHOLOGY

## 2023-08-19 ENCOUNTER — Ambulatory Visit
Admission: EM | Admit: 2023-08-19 | Discharge: 2023-08-19 | Disposition: A | Discharge status: Home / Self Care | Attending: Emergency Medicine | Admitting: Emergency Medicine

## 2023-08-19 DIAGNOSIS — J069 Acute upper respiratory infection, unspecified: Secondary | ICD-10-CM | POA: Diagnosis not present

## 2023-08-19 LAB — POC SARS CORONAVIRUS 2 AG -  ED: SARS Coronavirus 2 Ag: NEGATIVE

## 2023-08-19 MED ORDER — BENZONATATE 100 MG PO CAPS: 100.0000 mg | ORAL_CAPSULE | Freq: Three times a day (TID) | ORAL | 0 refills | Status: AC | PRN

## 2023-08-19 NOTE — ED Triage Notes (Signed)
 Patient to Urgent Care with complaints of chest congestion/ productive cough/ SHOB. Denies any known fevers.   Symptoms started two days ago. Felt worse yesterday.   Meds: pseudoephedrine HCL 30mg / dextromethorphan/ guaifenesin

## 2023-08-19 NOTE — Discharge Instructions (Addendum)
 The COVID test is negative.  Take the Tessalon  Perles as directed for cough.  Follow-up with your primary care provider if your symptoms are not improving.

## 2023-08-19 NOTE — ED Provider Notes (Signed)
 Calvin Wallace    CSN: [id number] Arrival date & time: 08/19/23  1478      History   Chief Complaint Chief Complaint  Patient presents with   Cough    HPI Calvin Wallace is a 83 y.o. male.  Patient presents with 2-day history of congestion and cough.  He reports shortness of breath with coughing episodes.  No fever.  No history of lung disease other than obstructive sleep apnea.  Treatment attempted with OTC cold medication.  The history is provided by the patient and medical records.    Past Medical History:  Diagnosis Date   Arthritis    Benign prostatic hyperplasia without lower urinary tract symptoms    Carpal tunnel syndrome, right    Diverticulosis    GERD (gastroesophageal reflux disease)    Headache    migraines   History of kidney stones    Hypercholesteremia    Hyperlipidemia    Hypertension    Insomnia    Migraines    Sleep apnea    OSA---USE C-PAP   Umbilical hernia     Patient Active Problem List   Diagnosis Date Noted   Arthritis of left knee 07/13/2022   Carpal tunnel syndrome of right wrist 03/31/2020   Insomnia 07/01/2018   Diverticulosis 11/12/2017   Obstructive sleep apnea on CPAP 07/30/2017   GERD (gastroesophageal reflux disease) 07/10/2017   Benign prostatic hyperplasia without lower urinary tract symptoms 07/14/2015   Essential hypertension 07/14/2015   Hyperlipidemia 07/14/2015    Past Surgical History:  Procedure Laterality Date   BUNIONECTOMY Left    CARDIAC CATHETERIZATION     CATARACT EXTRACTION W/PHACO Left 06/13/2022   Procedure: CATARACT EXTRACTION PHACO AND INTRAOCULAR LENS PLACEMENT (IOC) LEFT 5.53 0:39.9;  Surgeon: Clair Crews, MD;  Location: [facility];  Service: Ophthalmology;  Laterality: Left;   CATARACT EXTRACTION W/PHACO Right 06/27/2022   Procedure: CATARACT EXTRACTION PHACO AND INTRAOCULAR LENS PLACEMENT (IOC) RIGHT 5.87 00:38.3;  Surgeon: Clair Crews, MD;  Location: [facility];   Service: Ophthalmology;  Laterality: Right;   COLONOSCOPY     COLONOSCOPY WITH PROPOFOL  N/A 07/20/2017   Procedure: COLONOSCOPY WITH PROPOFOL ;  Surgeon: Cassie Click, MD;  Location: [facility] ENDOSCOPY;  Service: Endoscopy;  Laterality: N/A;   COLONOSCOPY WITH PROPOFOL  N/A 04/09/2023   Procedure: COLONOSCOPY WITH PROPOFOL ;  Surgeon: Quintin Buckle, DO;  Location: [facility] ENDOSCOPY;  Service: Gastroenterology;  Laterality: N/A;   ESOPHAGOGASTRODUODENOSCOPY (EGD) WITH PROPOFOL  N/A 07/20/2017   Procedure: ESOPHAGOGASTRODUODENOSCOPY (EGD) WITH PROPOFOL ;  Surgeon: Cassie Click, MD;  Location: [facility] END[facility]COPY;  Service: Endoscopy;  Laterality: N/A;   HERNIA REPAIR Right    Inguinal Hernia Repair   HERNIA REPAIR     Umbilical Hernia Repair   KNEE ARTHROSCOPY Right 09/21/2016   Procedure: ARTHROSCOPY KNEE MEDIAL MENISCECTOMY;  Surgeon: Molli Angelucci, MD;  Location: [facility] ORS;  Service: Orthopedics;  Laterality: Right;   LITHOTRIPSY     POLYPECTOMY  04/09/2023   Procedure: POLYPECTOMY;  Surgeon: Quintin Buckle, DO;  Location: [facility]COPY;  Service: Gastroenterology;;   SHOULDER ARTHROSCOPY WITH ROTATOR CUFF REPAIR Right 1998   TONSILLECTOMY     Age 9   UMBILICAL HERNIA REPAIR         Home Medications    Prior to Admission medications   Medication Sig Start Date End Date Taking? Authorizing Provider  benzonatate  (TESSALON ) 100 MG capsule Take 1 capsule (100 mg total) by mouth 3 (three) times daily as needed for cough. 08/19/23  Yes Wellington Half, NP  amLODipine (NORVASC) 5 MG tablet Take 5 mg by mouth daily.    [provider]  aspirin EC 81 MG tablet Take 81 mg by mouth daily. Swallow whole.    [provider]  desonide (DESOWEN) 0.05 % cream Apply topically as needed.    [provider]  diclofenac sodium (VOLTAREN) 1 % GEL Apply 1 application topically 2 (two) times daily as needed (pain).    [provider]  doxepin (SINEQUAN) 10 MG capsule  Take 10 mg by mouth at bedtime.    [provider]  finasteride (PROSCAR) 5 MG tablet Take 5 mg by mouth every evening.    [provider]  ketoconazole (NIZORAL) 2 % cream Apply 1 application topically once a week.    [provider]  lisinopril (PRINIVIL,ZESTRIL) 40 MG tablet Take 40 mg by mouth every evening.    [provider]  meclizine  (ANTIVERT ) 25 MG tablet Take 1 tablet (25 mg total) by mouth 3 (three) times daily as needed for dizziness. 12/04/22   Twilla Galea, MD  meloxicam (MOBIC) 7.5 MG tablet Take 1 tablet by mouth 2 (two) times daily with a meal. Patient not taking: Reported on 12/04/2022 07/13/22   [provider]  omeprazole (PRILOSEC) 20 MG capsule Take 20 mg by mouth daily. Patient not taking: Reported on 07/31/2022    [provider]  ondansetron  (ZOFRAN -ODT) 4 MG disintegrating tablet Take 1 tablet (4 mg total) by mouth every 8 (eight) hours as needed for nausea or vomiting. 12/04/22   Twilla Galea, MD  pravastatin (PRAVACHOL) 40 MG tablet Take 40 mg by mouth every evening.    [provider]  SUMAtriptan (IMITREX) 6 MG/0.5ML SOLN injection Inject 6 mg into the skin every 2 (two) hours as needed for migraine or headache. May repeat in 2 hours if headache persists or recurs. Patient not taking: Reported on 04/04/2023    [provider]  tamsulosin (FLOMAX) 0.4 MG CAPS capsule Take 0.4 mg by mouth daily.    [provider]    Family History Family History  Problem Relation Age of Onset   Breast cancer Mother    Diabetes Father    Hypertension Father    Stroke Father    Prostate cancer Father    Colon polyps Sister     Social History Social History   Tobacco Use   Smoking status: Never   Smokeless tobacco: Never  Vaping Use   Vaping status: Never Used  Substance Use Topics   Alcohol use: No   Drug use: No     Allergies   Chocolate flavoring agent (non-screening), Mushroom extract  complex (obsolete), Amino acids, and Cheese   Review of Systems Review of Systems  Constitutional:  Negative for chills and fever.  HENT:  Positive for congestion. Negative for ear pain and sore throat.   Respiratory:  Positive for cough and shortness of breath.      Physical Exam Triage Vital Signs ED Triage Vitals  Encounter Vitals Group     BP      Systolic BP Percentile      Diastolic BP Percentile      Pulse      Resp      Temp      Temp src      SpO2      Weight      Height      Head Circumference      Peak  Flow      Pain Score      Pain Loc      Pain Education      Exclude from Growth Chart    No data found.  Updated Vital Signs BP 104/67   Pulse (!) 101   Temp 98 F (36.7 C)   Resp 18   SpO2 96%   Visual Acuity Right Eye Distance:   Left Eye Distance:   Bilateral Distance:    Right Eye Near:   Left Eye Near:    Bilateral Near:     Physical Exam Constitutional:      General: He is not in acute distress. HENT:     Right Ear: Tympanic membrane normal.     Left Ear: Tympanic membrane normal.     Nose: Nose normal.     Mouth/Throat:     Mouth: Mucous membranes are moist.     Pharynx: Oropharynx is clear.     Comments: Clear PND Cardiovascular:     Rate and Rhythm: Normal rate and regular rhythm.     Heart sounds: Normal heart sounds.  Pulmonary:     Effort: Pulmonary effort is normal. No respiratory distress.     Breath sounds: Normal breath sounds.  Neurological:     Mental Status: He is alert.      UC Treatments / Results  Labs (all labs ordered are listed, but only abnormal results are displayed) Labs Reviewed  POC SARS CORONAVIRUS 2 AG -  ED    EKG   Radiology No results found.  Procedures Procedures (including critical care time)  Medications Ordered in UC Medications - No data to display  Initial Impression / Assessment and Plan / UC Course  I have reviewed the triage vital signs and the nursing notes.  Pertinent  labs & imaging results that were available during my care of the patient were reviewed by me and considered in my medical decision making (see chart for details).    Viral URI with cough.  Lungs are clear and O2 sat is 96%.  Rapid COVID-negative.  Treating cough with Tessalon  Perles.  Tylenol  as needed.  Education provided on viral respiratory infection.  Instructed patient to follow-up with his PCP if he is not improving.  He agrees to plan of care.  Final Clinical Impressions(s) / UC Diagnoses   Final diagnoses:  Viral URI with cough     Discharge Instructions      The COVID test is negative.  Take the Tessalon  Perles as directed for cough.  Follow-up with your primary care provider if your symptoms are not improving.      ED Prescriptions     Medication Sig Dispense Auth. Provider   benzonatate  (TESSALON ) 100 MG capsule Take 1 capsule (100 mg total) by mouth 3 (three) times daily as needed for cough. 21 capsule Wellington Half, NP      PDMP not reviewed this encounter.   Wellington Half, NP 08/19/23 [phone]

## 2024-05-26 ENCOUNTER — Ambulatory Visit: Admitting: Cardiology

## 2024-05-27 ENCOUNTER — Ambulatory Visit: Attending: Cardiology | Admitting: Cardiology

## 2024-05-27 VITALS — BP 120/58 | HR 100 | Ht 70.5 in | Wt 208.0 lb

## 2024-05-27 DIAGNOSIS — I10 Essential (primary) hypertension: Secondary | ICD-10-CM

## 2024-05-27 DIAGNOSIS — I493 Ventricular premature depolarization: Secondary | ICD-10-CM | POA: Diagnosis not present

## 2024-05-27 DIAGNOSIS — E7800 Pure hypercholesterolemia, unspecified: Secondary | ICD-10-CM

## 2024-05-27 NOTE — Patient Instructions (Signed)

## 2024-05-27 NOTE — Progress Notes (Signed)
 " Cardiology Office Note:    Date:  05/27/2024   ID:  Calvin Wallace, DOB February 10, 1941, MRN [id number]  PCP:  Glover Lenis, MD   [facility] Providers Cardiologist:  Redell Cave, MD Electrophysiologist:  OLE ONEIDA HOLTS, MD (Inactive)     Referring MD: Glover Lenis, MD   Chief Complaint  Patient presents with   Follow-up    Doing well, no complaints of CP.     History of Present Illness:    Calvin Wallace is a 84 y.o. male with a hx of hypertension, hyperlipidemia, frequent PVCs, GERD, OSA on CPAP who presents for follow-up.  Patient states doing okay, denies chest pain or shortness of breath.  Denies palpitations.blood pressure adequately controlled at home, compliant with current medications as prescribed, denies any current issues.  Prior notes/testing Echo 08/2022 EF 60 to 65% Cardiac monitor 07/2022, frequent PVCs, 19% burden.  No sustained arrhythmias.   Past Medical History:  Diagnosis Date   Arthritis    Benign prostatic hyperplasia without lower urinary tract symptoms    Carpal tunnel syndrome, right    Diverticulosis    GERD (gastroesophageal reflux disease)    Headache    migraines   History of kidney stones    Hypercholesteremia    Hyperlipidemia    Hypertension    Insomnia    Migraines    Sleep apnea    OSA---USE C-PAP   Umbilical hernia     Past Surgical History:  Procedure Laterality Date   BUNIONECTOMY Left    CARDIAC CATHETERIZATION     CATARACT EXTRACTION W/PHACO Left 06/13/2022   Procedure: CATARACT EXTRACTION PHACO AND INTRAOCULAR LENS PLACEMENT (IOC) LEFT 5.53 0:39.9;  Surgeon: Jaye Fallow, MD;  Location: [facility];  Service: Ophthalmology;  Laterality: Left;   CATARACT EXTRACTION W/PHACO Right 06/27/2022   Procedure: CATARACT EXTRACTION PHACO AND INTRAOCULAR LENS PLACEMENT (IOC) RIGHT 5.87 00:38.3;  Surgeon: Jaye Fallow, MD;  Location: [facility];  Service: Ophthalmology;  Laterality: Right;    COLONOSCOPY     COLONOSCOPY WITH PROPOFOL  N/A 07/20/2017   Procedure: COLONOSCOPY WITH PROPOFOL ;  Surgeon: Viktoria Lamar ONEIDA, MD;  Location: [facility] END[facility]COPY;  Service: Endoscopy;  Laterality: N/A;   COLONOSCOPY WITH PROPOFOL  N/A 04/09/2023   Procedure: COLONOSCOPY WITH PROPOFOL ;  Surgeon: Onita Elspeth Sharper, DO;  Location: [facility] ENDOSCOPY;  Service: Gastroenterology;  Laterality: N/A;   ESOPHAGOGASTRODUODENOSCOPY (EGD) WITH PROPOFOL  N/A 07/20/2017   Procedure: ESOPHAGOGASTRODUODENOSCOPY (EGD) WITH PROPOFOL ;  Surgeon: Viktoria Lamar ONEIDA, MD;  Location: [facility] ENDOSCOPY;  Service: Endoscopy;  Laterality: N/A;   HERNIA REPAIR Right    Inguinal Hernia Repair   HERNIA REPAIR     Umbilical Hernia Repair   KNEE ARTHROSCOPY Right 09/21/2016   Procedure: ARTHROSCOPY KNEE MEDIAL MENISCECTOMY;  Surgeon: Kathlynn Sharper, MD;  Location: [facility] ORS;  Service: Orthopedics;  Laterality: Right;   LITHOTRIPSY     POLYPECTOMY  04/09/2023   Procedure: POLYPECTOMY;  Surgeon: Onita Elspeth Sharper, DO;  Location: [facility] ENDOSCOPY;  Service: Gastroenterology;;   SHOULDER ARTHROSCOPY WITH ROTATOR CUFF REPAIR Right 1998   TONSILLECTOMY     Age 24   UMBILICAL HERNIA REPAIR      Current Medications: Current Meds  Medication Sig   amLODipine (NORVASC) 5 MG tablet Take 5 mg by mouth daily.   benzonatate  (TESSALON ) 100 MG capsule Take 1 capsule (100 mg total) by mouth 3 (three) times daily as needed for cough.   desonide (DESOWEN) 0.05 % cream Apply topically as needed.   diclofenac sodium (  VOLTAREN) 1 % GEL Apply 1 application topically 2 (two) times daily as needed (pain).   doxepin (SINEQUAN) 10 MG capsule Take 10 mg by mouth at bedtime.   finasteride (PROSCAR) 5 MG tablet Take 5 mg by mouth every evening.   ketoconazole (NIZORAL) 2 % cream Apply 1 application topically once a week.   lisinopril (PRINIVIL,ZESTRIL) 40 MG tablet Take 40 mg by mouth every evening.   meclizine  (ANTIVERT ) 25 MG tablet Take 1 tablet (25 mg total)  by mouth 3 (three) times daily as needed for dizziness.   omeprazole (PRILOSEC) 20 MG capsule Take 20 mg by mouth daily.   ondansetron  (ZOFRAN -ODT) 4 MG disintegrating tablet Take 1 tablet (4 mg total) by mouth every 8 (eight) hours as needed for nausea or vomiting.   pravastatin (PRAVACHOL) 40 MG tablet Take 40 mg by mouth every evening.   SUMAtriptan (IMITREX) 6 MG/0.5ML SOLN injection Inject 6 mg into the skin every 2 (two) hours as needed for migraine or headache. May repeat in 2 hours if headache persists or recurs.   tamsulosin (FLOMAX) 0.4 MG CAPS capsule Take 0.4 mg by mouth daily.     Allergies:   Chocolate flavoring agent (non-screening), Mushroom extract complex (obsolete), Amino acids, and Cheese   Social History   Socioeconomic History   Marital status: Married    Spouse name: Not on file   Number of children: Not on file   Years of education: Not on file   Highest education level: Not on file  Occupational History   Not on file  Tobacco Use   Smoking status: Never   Smokeless tobacco: Never  Vaping Use   Vaping status: Never Used  Substance and Sexual Activity   Alcohol use: No   Drug use: No   Sexual activity: Not on file    Comment: Married   Other Topics Concern   Not on file  Social History Narrative   Not on file   Social Drivers of Health   Tobacco Use: Low Risk (08/19/2023)   Patient History    Smoking Tobacco Use: Never    Smokeless Tobacco Use: Never    Passive Exposure: Not on file  Financial Resource Strain: Low Risk  (07/20/2023)   Received from [facility]   Overall Financial Resource Strain (CARDIA)    Difficulty of Paying Living Expenses: Not hard at all  Food Insecurity: No Food Insecurity (07/20/2023)   Received from [facility]   Epic    Within the past 12 months, you worried that your food would run out before you got the money to buy more.: Never true    Within the past 12 months, the food you bought  just didn't last and you didn't have money to get more.: Never true  Transportation Needs: No Transportation Needs (07/20/2023)   Received from [facility] - Transportation    In the past 12 months, has lack of transportation kept you from medical appointments or from getting medications?: No    Lack of Transportation (Non-Medical): No  Physical Activity: Not on file  Stress: Not on file  Social Connections: Not on file  Depression (EYV7-0): Not on file  Alcohol Screen: Not on file  Housing: Low Risk  (07/20/2023)   Received from [facility]   Epic    In the last 12 months, was there a time when you were not able to pay the mortgage or rent on  time?: No    In the past 12 months, how many times have you moved where you were living?: 0    At any time in the past 12 months, were you homeless or living in a shelter (including now)?: No  Utilities: Not At Risk (07/20/2023)   Received from [facility] Utilities    Threatened with loss of utilities: No  Health Literacy: Not on file     Family History: The patient's family history includes Breast cancer in his mother; Colon polyps in his sister; Diabetes in his father; Hypertension in his father; Prostate cancer in his father; Stroke in his father.  ROS:   Please see the history of present illness.     All other systems reviewed and are negative.  EKGs/Labs/Other Studies Reviewed:    The following studies were reviewed today:   EKG Interpretation Date/Time:  Tuesday May 27 2024 14:05:19 EST Ventricular Rate:  100 PR Interval:  172 QRS Duration:  148 QT Interval:  394 QTC Calculation: 508 R Axis:   -4  Text Interpretation: Sinus rhythm with Fusion complexes Possible Left atrial enlargement Right bundle branch block T wave abnormality, consider inferior ischemia Confirmed by Darliss Rogue ([id number]) on 05/27/2024 2:10:37 PM     Recent Labs: No results found  for requested labs within last 365 days.  Recent Lipid Panel No results found for: CHOL, TRIG, HDL, CHOLHDL, VLDL, LDLCALC, LDLDIRECT  Outside lipid panel 01/19/2019 total cholesterol 153, triglycerides 86, LDL 92, HDL 43.6  Risk Assessment/Calculations:               Physical Exam:    VS:  BP (!) 120/58 (BP Location: Left Arm, Patient Position: Sitting, Cuff Size: Normal)   Pulse 100   Ht 5' 10.5 (1.791 m)   Wt 208 lb (94.3 kg)   SpO2 95%   BMI 29.42 kg/m     Wt Readings from Last 3 Encounters:  05/27/24 208 lb (94.3 kg)  04/09/23 194 lb (88 kg)  04/04/23 199 lb 6.4 oz (90.4 kg)     GEN:  Well nourished, well developed in no acute distress HEENT: Normal NECK: No JVD; No carotid bruits CARDIAC: RRR, no murmurs, rubs, gallops RESPIRATORY:  Clear to auscultation without rales, wheezing or rhonchi  ABDOMEN: Soft, non-tender, non-distended MUSCULOSKELETAL:  No edema; No deformity  SKIN: Warm and dry NEUROLOGIC:  Alert and oriented x 3 PSYCHIATRIC:  Normal affect   ASSESSMENT:    1. PVC (premature ventricular contraction)   2. Primary hypertension   3. Pure hypercholesterolemia    PLAN:    In order of problems listed above:  Frequent PVCs, 19% burden on monitor 4/24.  Echo 5/24 normal EF 60 to 65%.  Patient is clinically asymptomatic. Continue to monitor.   Hypertension, BP controlled, continue lisinopril 40 mg, Norvasc 5 mg. Hyperlipidemia, continue Pravachol 40 mg daily  Follow-up in 12 months.     Medication Adjustments/Labs and Tests Ordered: Current medicines are reviewed at length with the patient today.  Concerns regarding medicines are outlined above.  Orders Placed This Encounter  Procedures   EKG 12-Lead   No orders of the defined types were placed in this encounter.   Patient Instructions  Medication Instructions:  Your physician recommends that you continue on your current medications as directed. Please refer to the Current  Medication list given to you today.   *If you need a refill on your cardiac medications before your next appointment, please  call your pharmacy*  Lab Work: No labs ordered today  If you have labs (blood work) drawn today and your tests are completely normal, you will receive your results only by: MyChart Message (if you have MyChart) OR A paper copy in the mail If you have any lab test that is abnormal or we need to change your treatment, we will call you to review the results.  Testing/Procedures: No test ordered today   Follow-Up: At [facility], you and your health needs are our priority.  As part of our continuing mission to provide you with exceptional heart care, our providers are all part of one team.  This team includes your primary Cardiologist (physician) and Advanced Practice Providers or APPs (Physician Assistants and Nurse Practitioners) who all work together to provide you with the care you need, when you need it.  Your next appointment:   1 year(s)  Provider:   You may see Redell Cave, MD or one of the following Advanced Practice Providers on your designated Care Team:   Lonni Meager, NP Lesley Maffucci, PA-C Bernardino Bring, PA-C Cadence [place], PA-C Tylene Lunch, NP Barnie Hila, NP    We recommend signing up for the patient portal called MyChart.  Sign up information is provided on this After Visit Summary.  MyChart is used to connect with patients for Virtual Visits (Telemedicine).  Patients are able to view lab/test results, encounter notes, upcoming appointments, etc.  Non-urgent messages can be sent to your provider as well.   To learn more about what you can do with MyChart, go to [email or web address].              Signed, Redell Cave, MD  05/27/2024 2:38 PM    [facility] "

## 2024-06-18 ENCOUNTER — Ambulatory Visit: Admitting: Cardiology
